# Patient Record
Sex: Female | Born: 1960 | Race: White | Hispanic: No | Marital: Single | Smoking: Never smoker
Health system: Southern US, Community
[De-identification: ages and names within clinical notes are randomized; demographics above are authoritative.]

## PROBLEM LIST (undated history)

## (undated) DIAGNOSIS — H409 Unspecified glaucoma: Secondary | ICD-10-CM

## (undated) DIAGNOSIS — G43909 Migraine, unspecified, not intractable, without status migrainosus: Secondary | ICD-10-CM

## (undated) DIAGNOSIS — R519 Headache, unspecified: Secondary | ICD-10-CM

## (undated) DIAGNOSIS — K635 Polyp of colon: Secondary | ICD-10-CM

## (undated) DIAGNOSIS — F32A Depression, unspecified: Secondary | ICD-10-CM

## (undated) DIAGNOSIS — E785 Hyperlipidemia, unspecified: Secondary | ICD-10-CM

## (undated) DIAGNOSIS — B019 Varicella without complication: Secondary | ICD-10-CM

## (undated) HISTORY — DX: Migraine, unspecified, not intractable, without status migrainosus: G43.909

## (undated) HISTORY — PX: HEMORRHOID SURGERY: SHX153

## (undated) HISTORY — DX: Unspecified glaucoma: H40.9

## (undated) HISTORY — DX: Varicella without complication: B01.9

## (undated) HISTORY — PX: ABDOMINAL HYSTERECTOMY: SHX81

## (undated) HISTORY — PX: TONSILLECTOMY: SUR1361

## (undated) HISTORY — PX: APPENDECTOMY: SHX54

## (undated) HISTORY — DX: Headache, unspecified: R51.9

## (undated) HISTORY — DX: Depression, unspecified: F32.A

## (undated) HISTORY — DX: Hyperlipidemia, unspecified: E78.5

## (undated) HISTORY — DX: Polyp of colon: K63.5

## (undated) HISTORY — PX: POLYPECTOMY: SHX149

---

## 2012-05-06 HISTORY — PX: INCONTINENCE SURGERY: SHX676

## 2015-12-05 HISTORY — PX: COLONOSCOPY: SHX174

## 2020-05-12 LAB — BASIC METABOLIC PANEL
BUN: 12 (ref 4–21)
CO2: 31 — AB (ref 13–22)
Chloride: 107 (ref 99–108)
Creatinine: 0.9 (ref 0.5–1.1)
Glucose: 98
Potassium: 4 mEq/L (ref 3.5–5.1)
Sodium: 142 (ref 137–147)

## 2020-05-12 LAB — CBC AND DIFFERENTIAL
HCT: 40 (ref 36–46)
Hemoglobin: 12.7 (ref 12.0–16.0)
Platelets: 231 10*3/uL (ref 150–400)
WBC: 6.1

## 2020-05-12 LAB — HEPATIC FUNCTION PANEL
ALT: 18 U/L (ref 7–35)
AST: 22 (ref 13–35)
Alkaline Phosphatase: 72 (ref 25–125)
Bilirubin, Total: 0.5

## 2020-05-12 LAB — LIPID PANEL
Cholesterol: 167 (ref 0–200)
HDL: 59 (ref 35–70)
LDL Cholesterol: 85
Triglycerides: 116 (ref 40–160)

## 2020-05-12 LAB — COMPREHENSIVE METABOLIC PANEL
Albumin: 4.3 (ref 3.5–5.0)
Calcium: 9 (ref 8.7–10.7)

## 2020-05-12 LAB — VITAMIN D 25 HYDROXY (VIT D DEFICIENCY, FRACTURES): Vit D, 25-Hydroxy: 44.8

## 2020-05-12 LAB — TSH: TSH: 1.75 (ref 0.41–5.90)

## 2021-09-25 ENCOUNTER — Encounter: Payer: Self-pay | Admitting: Family Medicine

## 2021-09-25 ENCOUNTER — Ambulatory Visit (INDEPENDENT_AMBULATORY_CARE_PROVIDER_SITE_OTHER): Payer: 59 | Admitting: Family Medicine

## 2021-09-25 VITALS — BP 128/80 | HR 93 | Temp 98.8°F | Resp 16 | Ht 67.0 in | Wt 170.2 lb

## 2021-09-25 DIAGNOSIS — E785 Hyperlipidemia, unspecified: Secondary | ICD-10-CM

## 2021-09-25 DIAGNOSIS — Z1239 Encounter for other screening for malignant neoplasm of breast: Secondary | ICD-10-CM

## 2021-09-25 DIAGNOSIS — D229 Melanocytic nevi, unspecified: Secondary | ICD-10-CM

## 2021-09-25 DIAGNOSIS — F3341 Major depressive disorder, recurrent, in partial remission: Secondary | ICD-10-CM

## 2021-09-25 DIAGNOSIS — F419 Anxiety disorder, unspecified: Secondary | ICD-10-CM

## 2021-09-25 DIAGNOSIS — Z1211 Encounter for screening for malignant neoplasm of colon: Secondary | ICD-10-CM

## 2021-09-25 NOTE — Patient Instructions (Addendum)
A few things to remember from today's visit:   Colon cancer screening - Plan: Ambulatory referral to Gastroenterology  Hyperlipidemia, unspecified hyperlipidemia type - Plan: Comprehensive metabolic panel, Lipid panel  Encounter for screening for malignant neoplasm of breast, unspecified screening modality - Plan: MM 3D SCREEN BREAST BILATERAL  Recurrent major depression in partial remission ([place])  Atypical nevus - Plan: Ambulatory referral to Dermatology  If you need refills please call your pharmacy. Do not use My Chart to request refills or for acute issues that need immediate attention.   Please be sure medication list is accurate. If a new problem present, please set up appointment sooner than planned today.  No changes today. We can try decreasing sertraline next visit.

## 2021-09-25 NOTE — Progress Notes (Addendum)
HPI: [name] is a 61 y.o. female, who is here today to establish care.  Former PCP: In [place]. Last preventive routine visit: 10/2020. She just moved to the area from [place] She moved to be closer to her daughter, she is driving her to work. Her daughter has hx of severe ADHD and does not drive.   Chronic medical problems: Depression,migraine headaches,HLD,glaucoma,and chronic back pain among some. Father colon cancer at 33. Last colonoscopy about 5 years ago. Due for mammogram. S/P hysterectomy, no hx of gyn malignancy.  Fall in 10/2020, left ankle fracture.  Concerns today:  Couple of moles removed by dermatologist. She is concerned about mole on back. Has had atypical moles removed. No hx of skin cancer.  Chronic lower back pain, 06/2018. Has received an epidural injection. Pain is not radiated. Negative for saddle anesthesia or bladder/bowel dysfunction. She was seeing spine specialist in [place].  Depression and anxiety: Depression dx'ed about 30 years ago. Anxiety since her early 20's. She has seen psychiatrist but for the past few years her pcp has been prescribing medication.Last visit with psychiatrist about 5 years ago. She is on Sertraline 100 mg 2 tabs daily and Wellbutrin XL 150 mg daily. Denies depressed mood.     09/25/2021    2:38 PM  Depression screen PHQ 2/9  Decreased Interest 0  Down, Depressed, Hopeless 0  PHQ - 2 Score 0   HLD on Simvastatin 40 mg daily. She follows low fat diet. She has tolerated medication well.  Review of Systems  Constitutional:  Negative for activity change, appetite change and fever.  HENT:  Negative for mouth sores, nosebleeds and sore throat.   Eyes:  Negative for redness and visual disturbance.  Respiratory:  Negative for cough, shortness of breath and wheezing.   Cardiovascular:  Negative for chest pain, palpitations and leg swelling.  Gastrointestinal:  Negative for abdominal pain, blood in stool, nausea and  vomiting.       Negative for changes in bowel habits.  Genitourinary:  Negative for decreased urine volume and hematuria.  Neurological:  Negative for syncope, facial asymmetry and weakness.  Rest see pertinent positives and negatives per HPI.  Current Outpatient Medications on File Prior to Visit  Medication Sig Dispense Refill   buPROPion (WELLBUTRIN XL) 150 MG 24 hr tablet Take 150 mg by mouth every morning.     latanoprost (XALATAN) 0.005 % ophthalmic solution Place 1 drop into both eyes every evening.     sertraline (ZOLOFT) 100 MG tablet Take 200 mg by mouth daily.     simvastatin (ZOCOR) 40 MG tablet Take 1 tablet by mouth daily.     No current facility-administered medications on file prior to visit.   Past Medical History:  Diagnosis Date   Chicken pox    Colon polyps    Depression    Frequent headaches    Glaucoma    Hyperlipidemia    Migraines    No Known Allergies  Family History  Problem Relation Age of Onset   Hyperlipidemia Mother    Hearing loss Mother    Hypertension Father    Early death Father    Diabetes Father    Cancer Father    Social History   Socioeconomic History   Marital status: Single    Spouse name: Not on file   Number of children: Not on file   Years of education: Not on file   Highest education level: Not on file  Occupational History  Not on file  Tobacco Use   Smoking status: Never    Passive exposure: Never   Smokeless tobacco: Never  Substance and Sexual Activity   Alcohol use: Not Currently   Drug use: Never   Sexual activity: Not Currently  Other Topics Concern   Not on file  Social History Narrative   Not on file   Social Determinants of Health   Financial Resource Strain: Not on file  Food Insecurity: Not on file  Transportation Needs: Not on file  Physical Activity: Not on file  Stress: Not on file  Social Connections: Not on file   Vitals:   09/25/21 1424  BP: 128/80  Pulse: 93  Resp: 16  Temp: 98.8  F (37.1 C)  SpO2: 97%   Body mass index is 26.66 kg/m.  Physical Exam Vitals and nursing note reviewed.  Constitutional:      General: She is not in acute distress.    Appearance: She is well-developed.  HENT:     Head: Normocephalic and atraumatic.     Mouth/Throat:     Mouth: Mucous membranes are moist.     Pharynx: Oropharynx is clear.  Eyes:     Conjunctiva/sclera: Conjunctivae normal.  Cardiovascular:     Rate and Rhythm: Normal rate and regular rhythm.     Pulses:          Dorsalis pedis pulses are 2+ on the right side and 2+ on the left side.     Heart sounds: No murmur heard. Pulmonary:     Effort: Pulmonary effort is normal. No respiratory distress.     Breath sounds: Normal breath sounds.  Abdominal:     Palpations: Abdomen is soft. There is no hepatomegaly or mass.     Tenderness: There is no abdominal tenderness.  Lymphadenopathy:     Cervical: No cervical adenopathy.  Skin:    General: Skin is warm.     Findings: No erythema or rash.     Comments: No suspicious lesions. Hyperpigmented raised lesions scattered on body:AK  Neurological:     General: No focal deficit present.     Mental Status: She is alert and oriented to person, place, and time.     Cranial Nerves: No cranial nerve deficit.     Gait: Gait normal.  Psychiatric:     Comments: Well groomed, good eye contact.   ASSESSMENT AND PLAN:  Desiree Doyle was seen today for establish care.  Diagnoses and all orders for this visit: Orders Placed This Encounter  Procedures   MM 3D SCREEN BREAST BILATERAL   Comprehensive metabolic panel   Lipid panel   Ambulatory referral to Gastroenterology   Ambulatory referral to Dermatology   Lab Results  Component Value Date   CHOL 190 09/25/2021   HDL 69.90 09/25/2021   LDLCALC 91 09/25/2021   TRIG 145.0 09/25/2021   CHOLHDL 3 09/25/2021   Lab Results  Component Value Date   CREATININE 0.94 09/25/2021   BUN 9 09/25/2021   NA 140 09/25/2021   K 4.3  09/25/2021   CL 103 09/25/2021   CO2 27 09/25/2021   Lab Results  Component Value Date   ALT 17 09/25/2021   AST 23 09/25/2021   ALKPHOS 65 09/25/2021   BILITOT 0.6 09/25/2021   Hyperlipidemia, unspecified hyperlipidemia type Continue Simvastatin 40 mg daily and low fat diet. Further recommendations according to lipid panel results.  Encounter for screening for malignant neoplasm of breast, unspecified screening modality -  MM 3D SCREEN BREAST BILATERAL; Future  Recurrent major depression in partial remission ([place]) For now continue Wellbutrin XL 150 mg daily and Sertraline 200 mg daily. In a few months we can consider starting weaning off medications as tolerated.  Atypical nevus Hx of atypical melanocytic moles. Derma referral placed.  Anxiety disorder, unspecified type Well controlled. Continue Sertraline 200 mg daily, will consider decreasing dose next visit.  Colon cancer screening -     Ambulatory referral to Gastroenterology  Return in about 6 months (around 03/28/2022).  Desiree Doyle G. Martinique, MD  [facility]. [place] office.

## 2021-09-26 LAB — COMPREHENSIVE METABOLIC PANEL
ALT: 17 U/L (ref 0–35)
AST: 23 U/L (ref 0–37)
Albumin: 5.1 g/dL (ref 3.5–5.2)
Alkaline Phosphatase: 65 U/L (ref 39–117)
BUN: 9 mg/dL (ref 6–23)
CO2: 27 mEq/L (ref 19–32)
Calcium: 10.1 mg/dL (ref 8.4–10.5)
Chloride: 103 mEq/L (ref 96–112)
Creatinine, Ser: 0.94 mg/dL (ref 0.40–1.20)
GFR: 65.42 mL/min (ref >60.00)
Glucose, Bld: 91 mg/dL (ref 70–99)
Potassium: 4.3 mEq/L (ref 3.5–5.1)
Sodium: 140 mEq/L (ref 135–145)
Total Bilirubin: 0.6 mg/dL (ref 0.2–1.2)
Total Protein: 7.7 g/dL (ref 6.0–8.3)

## 2021-09-26 LAB — LIPID PANEL
Cholesterol: 190 mg/dL (ref 0–200)
HDL: 69.9 mg/dL (ref >39.00)
LDL Cholesterol: 91 mg/dL (ref 0–99)
NonHDL: 119.87
Total CHOL/HDL Ratio: 3
Triglycerides: 145 mg/dL (ref 0.0–149.0)
VLDL: 29 mg/dL (ref 0.0–40.0)

## 2021-10-04 ENCOUNTER — Ambulatory Visit
Admission: RE | Admit: 2021-10-04 | Discharge: 2021-10-04 | Disposition: A | Discharge status: Home / Self Care | Payer: 59 | Source: Ambulatory Visit | Attending: Family Medicine | Admitting: Family Medicine

## 2021-10-04 DIAGNOSIS — Z1239 Encounter for other screening for malignant neoplasm of breast: Secondary | ICD-10-CM

## 2021-10-12 ENCOUNTER — Encounter: Payer: Self-pay | Admitting: Family Medicine

## 2021-11-13 ENCOUNTER — Telehealth: Payer: Self-pay | Admitting: Gastroenterology

## 2021-11-13 NOTE — Telephone Encounter (Signed)
Good Afternoon Dr. Ardis Hughs,  D.O.D for 09/25/21 PM    We have received a referral to have a colonoscopy and we have also received patients last colonoscopy records. Patient had her last colonoscopy in [place] in 2017. I will be sending patients records for you to review, will you please review and advise on scheduling?  Thank you.

## 2021-11-14 ENCOUNTER — Encounter: Payer: Self-pay | Admitting: Gastroenterology

## 2021-11-14 NOTE — Telephone Encounter (Signed)
Patient was scheduled for PV on 8/25 at 9:00 and colon on 9/15 at 2:00.

## 2021-12-24 ENCOUNTER — Telehealth: Payer: Self-pay | Admitting: Gastroenterology

## 2021-12-24 NOTE — Telephone Encounter (Signed)
This patient returned your call.  She said she was out of town when you called her initially.  Thank you.

## 2021-12-24 NOTE — Telephone Encounter (Signed)
Spoke with pt.  Rescheduled with Dr. Havery Moros for the exact date and time she was previously scheduled

## 2021-12-28 ENCOUNTER — Ambulatory Visit (AMBULATORY_SURGERY_CENTER): Payer: 59 | Admitting: *Deleted

## 2021-12-28 VITALS — Ht 67.0 in | Wt 169.0 lb

## 2021-12-28 DIAGNOSIS — Z86010 Personal history of colonic polyps: Secondary | ICD-10-CM

## 2021-12-28 DIAGNOSIS — Z800 Family history of malignant neoplasm of digestive organs: Secondary | ICD-10-CM

## 2021-12-28 MED ORDER — NA SULFATE-K SULFATE-MG SULF 17.5-3.13-1.6 GM/177ML PO SOLN: 1.0000 | ORAL | 0 refills | Status: DC

## 2021-12-28 MED ORDER — ONDANSETRON HCL 4 MG PO TABS: 4.0000 mg | ORAL_TABLET | ORAL | 0 refills | Status: DC

## 2021-12-28 NOTE — Progress Notes (Signed)
Patient is here in-person for PV. Patient denies any allergies to eggs or soy. Patient denies any problems with anesthesia/sedation. Patient is not on any oxygen at home. Patient is not taking any diet/weight loss medications or blood thinners. Went over procedure prep instructions with the patient. Patient is aware of our care-partner policy. Patient notified to use Good-Rx for prescription.    

## 2022-01-03 ENCOUNTER — Encounter: Payer: Self-pay | Admitting: Gastroenterology

## 2022-01-05 ENCOUNTER — Other Ambulatory Visit: Payer: Self-pay | Admitting: Family Medicine

## 2022-01-05 DIAGNOSIS — F3341 Major depressive disorder, recurrent, in partial remission: Secondary | ICD-10-CM

## 2022-01-05 DIAGNOSIS — F419 Anxiety disorder, unspecified: Secondary | ICD-10-CM

## 2022-01-05 DIAGNOSIS — E785 Hyperlipidemia, unspecified: Secondary | ICD-10-CM

## 2022-01-08 MED ORDER — BUPROPION HCL ER (XL) 150 MG PO TB24: 150.0000 mg | ORAL_TABLET | Freq: Every morning | ORAL | 2 refills | Status: DC

## 2022-01-08 MED ORDER — SIMVASTATIN 40 MG PO TABS: 40.0000 mg | ORAL_TABLET | Freq: Every day | ORAL | 2 refills | Status: DC

## 2022-01-08 MED ORDER — SERTRALINE HCL 100 MG PO TABS: 150.0000 mg | ORAL_TABLET | Freq: Every day | ORAL | 2 refills | Status: DC

## 2022-01-18 ENCOUNTER — Encounter: Payer: Self-pay | Admitting: Gastroenterology

## 2022-01-18 ENCOUNTER — Ambulatory Visit (AMBULATORY_SURGERY_CENTER): Payer: 59 | Admitting: Gastroenterology

## 2022-01-18 VITALS — BP 129/73 | HR 63 | Temp 97.5°F | Resp 13 | Ht 67.0 in | Wt 169.0 lb

## 2022-01-18 DIAGNOSIS — D123 Benign neoplasm of transverse colon: Secondary | ICD-10-CM

## 2022-01-18 DIAGNOSIS — D124 Benign neoplasm of descending colon: Secondary | ICD-10-CM

## 2022-01-18 DIAGNOSIS — Z09 Encounter for follow-up examination after completed treatment for conditions other than malignant neoplasm: Secondary | ICD-10-CM

## 2022-01-18 DIAGNOSIS — Z800 Family history of malignant neoplasm of digestive organs: Secondary | ICD-10-CM | POA: Diagnosis not present

## 2022-01-18 DIAGNOSIS — Z86010 Personal history of colonic polyps: Secondary | ICD-10-CM | POA: Diagnosis not present

## 2022-01-18 MED ORDER — SODIUM CHLORIDE 0.9 % IV SOLN: 500.0000 mL | Freq: Once | INTRAVENOUS | Status: DC

## 2022-01-18 NOTE — Progress Notes (Signed)
Called to room to assist during endoscopic procedure.  Patient ID and intended procedure confirmed with present staff. Received instructions for my participation in the procedure from the performing physician.  

## 2022-01-18 NOTE — Progress Notes (Signed)
[facility] History and Physical   Primary Care Physician:  Martinique, Betty G, MD   Reason for Procedure:   History of colon polyps  Plan:    colonoscopy     HPI: Desiree Doyle is a 61 y.o. female  here for colonoscopy surveillance - SSPs removed in > 5 yrs ago at another center ([place]). Patient denies any bowel symptoms at this time. Father had colon cancer dx age 102. Otherwise feels well without any cardiopulmonary symptoms.   I have discussed risks / benefits of anesthesia and endoscopic procedure with Maximino Greenland and they wish to proceed with the exams as outlined today.    Past Medical History:  Diagnosis Date   Chicken pox    Colon polyps    Depression    Frequent headaches    Glaucoma    Hyperlipidemia    Migraines     Past Surgical History:  Procedure Laterality Date   ABDOMINAL HYSTERECTOMY     APPENDECTOMY     COLONOSCOPY  12/2015   [facility]     INCONTINENCE SURGERY  2014   POLYPECTOMY     TONSILLECTOMY      Prior to Admission medications   Medication Sig Start Date End Date Taking? Authorizing Provider  buPROPion (WELLBUTRIN XL) 150 MG 24 hr tablet Take 1 tablet (150 mg total) by mouth every morning. 01/08/22  Yes Martinique, Betty G, MD  CALCIUM PO Take by mouth.   Yes [provider]  Cholecalciferol (VITAMIN D3 PO) Take by mouth.   Yes [provider]  latanoprost (XALATAN) 0.005 % ophthalmic solution Place 1 drop into both eyes every evening. 08/10/21  Yes [provider]  ondansetron (ZOFRAN) 4 MG tablet Take 1 tablet (4 mg total) by mouth as directed. Take one Zofran pill 30-60 minutes before each colonoscopy prep dose 12/28/21  Yes Witt Plitt, Carlota Raspberry, MD  sertraline (ZOLOFT) 100 MG tablet Take 1.5 tablets (150 mg total) by mouth daily. 01/08/22  Yes Martinique, Betty G, MD  simvastatin (ZOCOR) 40 MG tablet Take 1 tablet (40 mg total) by mouth daily. 01/08/22  Yes Martinique, Betty G, MD  Multiple Vitamins-Minerals  (PRESERVISION AREDS PO) Take by mouth.    [provider]    Current Outpatient Medications  Medication Sig Dispense Refill   buPROPion (WELLBUTRIN XL) 150 MG 24 hr tablet Take 1 tablet (150 mg total) by mouth every morning. 30 tablet 2   CALCIUM PO Take by mouth.     Cholecalciferol (VITAMIN D3 PO) Take by mouth.     latanoprost (XALATAN) 0.005 % ophthalmic solution Place 1 drop into both eyes every evening.     ondansetron (ZOFRAN) 4 MG tablet Take 1 tablet (4 mg total) by mouth as directed. Take one Zofran pill 30-60 minutes before each colonoscopy prep dose 2 tablet 0   sertraline (ZOLOFT) 100 MG tablet Take 1.5 tablets (150 mg total) by mouth daily. 45 tablet 2   simvastatin (ZOCOR) 40 MG tablet Take 1 tablet (40 mg total) by mouth daily. 30 tablet 2   Multiple Vitamins-Minerals (PRESERVISION AREDS PO) Take by mouth.     Current Facility-Administered Medications  Medication Dose Route Frequency Provider Last Rate Last Admin   0.9 %  sodium chloride infusion  500 mL Intravenous Once Claribel Sachs, Carlota Raspberry, MD        Allergies as of 01/18/2022   (No Known Allergies)    Family History  Problem Relation Age of Onset  Hyperlipidemia Mother    Hearing loss Mother    Hypertension Father    Early death Father    Diabetes Father    Colon cancer Father 49   Breast cancer Maternal Aunt        x2 22s or 22s   Esophageal cancer Neg Hx    Stomach cancer Neg Hx    Rectal cancer Neg Hx     Social History   Socioeconomic History   Marital status: Single    Spouse name: Not on file   Number of children: Not on file   Years of education: Not on file   Highest education level: Not on file  Occupational History   Not on file  Tobacco Use   Smoking status: Never    Passive exposure: Never   Smokeless tobacco: Never  Vaping Use   Vaping Use: Never used  Substance and Sexual Activity   Alcohol use: Yes    Comment: once a month   Drug use: Never   Sexual activity: Not  Currently  Other Topics Concern   Not on file  Social History Narrative   Not on file   Social Determinants of Health   Financial Resource Strain: Not on file  Food Insecurity: Not on file  Transportation Needs: Not on file  Physical Activity: Not on file  Stress: Not on file  Social Connections: Not on file  Intimate Partner Violence: Not on file    Review of Systems: All other review of systems negative except as mentioned in the HPI.  Physical Exam: Vital signs BP (!) 153/95   Pulse 88   Temp (!) 97.5 F (36.4 C)   Ht '5\' 7"'$  (1.702 m)   Wt 169 lb (76.7 kg)   SpO2 95%   BMI 26.47 kg/m   General:   Alert,  Well-developed, pleasant and cooperative in NAD Lungs:  Clear throughout to auscultation.   Heart:  Regular rate and rhythm Abdomen:  Soft, nontender and nondistended.   Neuro/Psych:  Alert and cooperative. Normal mood and affect. A and O x 3  Jolly Mango, MD [facility]

## 2022-01-18 NOTE — Op Note (Signed)
[facility] Patient Name: Desiree Doyle Procedure Date: 01/18/2022 2:15 PM MRN: [id number] Endoscopist: Remo Lipps P. Havery Moros , MD Age: 61 Referring MD:  Date of Birth: [birth year] Gender: Female Account #: [id number] Procedure:                Colonoscopy Indications:              High risk colon cancer surveillance: Personal                            history of colonic polyps - sessile serrated polyps                            removed > 5 years ago, father had colon cancer dx                            age 78 Medicines:                Monitored Anesthesia Care Procedure:                Pre-Anesthesia Assessment:                           - Prior to the procedure, a History and Physical                            was performed, and patient medications and                            allergies were reviewed. The patient's tolerance of                            previous anesthesia was also reviewed. The risks                            and benefits of the procedure and the sedation                            options and risks were discussed with the patient.                            All questions were answered, and informed consent                            was obtained. Prior Anticoagulants: The patient has                            taken no previous anticoagulant or antiplatelet                            agents. ASA Grade Assessment: II - A patient with                            mild systemic disease. After reviewing the risks  and benefits, the patient was deemed in                            satisfactory condition to undergo the procedure.                           After obtaining informed consent, the colonoscope                            was passed under direct vision. Throughout the                            procedure, the patient's blood pressure, pulse, and                            oxygen saturations were monitored continuously. The                             Olympus PCF-H190DL (#[id number]) Colonoscope was                            introduced through the anus and advanced to the the                            cecum, identified by appendiceal orifice and                            ileocecal valve. The colonoscopy was technically                            difficult and complex due to inadequate bowel prep                            and a tortuous colon. The patient tolerated the                            procedure well. The quality of the bowel                            preparation was fair. The ileocecal valve,                            appendiceal orifice, and rectum were photographed. Scope In: 2:19:29 PM Scope Out: 2:45:38 PM Scope Withdrawal Time: 0 hours 16 minutes 57 seconds  Total Procedure Duration: 0 hours 26 minutes 9 seconds  Findings:                 The perianal and digital rectal examinations were                            normal.                           A large amount of liquid stool was found in the  entire colon, making visualization difficult.                            Lavage of the colon was performed using copious                            amounts of sterile water, resulting in clearance                            with fair visualization. Most of the colon was well                            visualized. Cecum and parts of the sigmoid were                            hard to clear due to residual stool. No large                            polyps noted but flat lesions or smaller polyps may                            not have been seen in certain areas.                           Two sessile polyps were found in the transverse                            colon. The polyps were 3 to 5 mm in size. These                            polyps were removed with a cold snare. Resection                            and retrieval were complete.                           A 3 mm polyp  was found in the descending colon. The                            polyp was sessile. The polyp was removed with a                            cold snare. Resection and retrieval were complete.                           A few small-mouthed diverticula were found in the                            sigmoid colon.                           The colon was extremely tortuous. This, in  conjunction with prep, made for difficult cecal                            intubation.                           Anal papilla(e) were hypertrophied.                           Internal hemorrhoids were found during                            retroflexion. The hemorrhoids were small.                           The exam was otherwise without abnormality. Complications:            No immediate complications. Estimated blood loss:                            Minimal. Estimated Blood Loss:     Estimated blood loss was minimal. Impression:               - Preparation of the colon was fair. Significant                            time spent lavaging the colon.                           - Two 3 to 5 mm polyps in the transverse colon,                            removed with a cold snare. Resected and retrieved.                           - One 3 mm polyp in the descending colon, removed                            with a cold snare. Resected and retrieved.                           - Diverticulosis in the sigmoid colon.                           - Tortuous colon.                           - Anal papilla(e) were hypertrophied.                           - Internal hemorrhoids.                           - The examination was otherwise normal. Recommendation:           - Patient has a contact number available for  emergencies. The signs and symptoms of potential                            delayed complications were discussed with the                            patient. Return to  normal activities tomorrow.                            Written discharge instructions were provided to the                            patient.                           - Resume previous diet.                           - Continue present medications.                           - Await pathology results.                           - Repeat colonoscopy within 1 year because the                            bowel preparation was suboptimal. Remo Lipps P. Terrilynn Postell, MD 01/18/2022 2:51:42 PM This report has been signed electronically.

## 2022-01-18 NOTE — Progress Notes (Signed)
PT taken to PACU. Monitors in place. VSS. Report given to RN. 

## 2022-01-18 NOTE — Progress Notes (Signed)
Pt's states no medical or surgical changes since previsit or office visit. 

## 2022-01-18 NOTE — Patient Instructions (Signed)
Handouts on diverticulosis, polyps, and hemorrhoids given to patient.  Await pathology results. Repeat colonoscopy for surveillance within 1 year because the bowel preparation was sub-optimal. Resume previous diet and continue present medications    YOU HAD AN ENDOSCOPIC PROCEDURE TODAY AT [facility]:   Refer to the procedure report that was given to you for any specific questions about what was found during the examination.  If the procedure report does not answer your questions, please call your gastroenterologist to clarify.  If you requested that your care partner not be given the details of your procedure findings, then the procedure report has been included in a sealed envelope for you to review at your convenience later.  YOU SHOULD EXPECT: Some feelings of bloating in the abdomen. Passage of more gas than usual.  Walking can help get rid of the air that was put into your GI tract during the procedure and reduce the bloating. If you had a lower endoscopy (such as a colonoscopy or flexible sigmoidoscopy) you may notice spotting of blood in your stool or on the toilet paper. If you underwent a bowel prep for your procedure, you may not have a normal bowel movement for a few days.  Please Note:  You might notice some irritation and congestion in your nose or some drainage.  This is from the oxygen used during your procedure.  There is no need for concern and it should clear up in a day or so.  SYMPTOMS TO REPORT IMMEDIATELY:  Following lower endoscopy (colonoscopy or flexible sigmoidoscopy):  Excessive amounts of blood in the stool  Significant tenderness or worsening of abdominal pains  Swelling of the abdomen that is new, acute  Fever of 100F or higher  For urgent or emergent issues, a gastroenterologist can be reached at any hour by calling [phone]. Do not use MyChart messaging for urgent concerns.    DIET:  We do recommend a small meal at first, but then you  may proceed to your regular diet.  Drink plenty of fluids but you should avoid alcoholic beverages for 24 hours.  ACTIVITY:  You should plan to take it easy for the rest of today and you should NOT DRIVE or use heavy machinery until tomorrow (because of the sedation medicines used during the test).    FOLLOW UP: Our staff will call the number listed on your records the next business day following your procedure.  We will call around 7:15- 8:00 am to check on you and address any questions or concerns that you may have regarding the information given to you following your procedure. If we do not reach you, we will leave a message.     If any biopsies were taken you will be contacted by phone or by letter within the next 1-3 weeks.  Please call us at [phone] if you have not heard about the biopsies in 3 weeks.    SIGNATURES/CONFIDENTIALITY: You and/or your care partner have signed paperwork which will be entered into your electronic medical record.  These signatures attest to the fact that that the information above on your After Visit Summary has been reviewed and is understood.  Full responsibility of the confidentiality of this discharge information lies with you and/or your care-partner.

## 2022-01-21 ENCOUNTER — Telehealth: Payer: Self-pay | Admitting: *Deleted

## 2022-01-21 NOTE — Telephone Encounter (Signed)
Attempted to call patient for their post-procedure follow-up call. No answer. Left voicemail.   

## 2022-02-01 ENCOUNTER — Other Ambulatory Visit: Payer: Self-pay

## 2022-02-01 DIAGNOSIS — F3341 Major depressive disorder, recurrent, in partial remission: Secondary | ICD-10-CM

## 2022-02-01 DIAGNOSIS — F419 Anxiety disorder, unspecified: Secondary | ICD-10-CM

## 2022-02-01 MED ORDER — BUPROPION HCL ER (XL) 150 MG PO TB24: 150.0000 mg | ORAL_TABLET | Freq: Every morning | ORAL | 1 refills | Status: DC

## 2022-02-01 MED ORDER — SERTRALINE HCL 100 MG PO TABS: 150.0000 mg | ORAL_TABLET | Freq: Every day | ORAL | 1 refills | Status: DC

## 2022-04-01 ENCOUNTER — Ambulatory Visit: Payer: 59 | Admitting: Family Medicine

## 2022-04-01 ENCOUNTER — Encounter: Payer: Self-pay | Admitting: Family Medicine

## 2022-04-01 VITALS — BP 120/70 | HR 96 | Temp 98.4°F | Resp 16 | Ht 67.0 in | Wt 168.4 lb

## 2022-04-01 DIAGNOSIS — F3341 Major depressive disorder, recurrent, in partial remission: Secondary | ICD-10-CM | POA: Diagnosis not present

## 2022-04-01 DIAGNOSIS — F419 Anxiety disorder, unspecified: Secondary | ICD-10-CM | POA: Diagnosis not present

## 2022-04-01 DIAGNOSIS — Z23 Encounter for immunization: Secondary | ICD-10-CM

## 2022-04-01 MED ORDER — SERTRALINE HCL 50 MG PO TABS: 125.0000 mg | ORAL_TABLET | Freq: Every day | ORAL | 2 refills | Status: DC

## 2022-04-01 NOTE — Patient Instructions (Addendum)
A few things to remember from today's visit:  Anxiety disorder, unspecified type  Recurrent major depression in partial remission ([place])  Need for influenza vaccination - Plan: Flu Vaccine QUAD 51moIM (Fluarix, Fluzone & Alfiuria Quad PF)  Decrease dose of Sertraline to continue alternating between 150 and 125 mg for 2 weeks then 125 mg daily. No changes in Wellbutrin.  If you need refills for medications you take chronically, please call your pharmacy. Do not use My Chart to request refills or for acute issues that need immediate attention. If you send a my chart message, it may take a few days to be addressed, specially if I am not in the office.  Please be sure medication list is accurate. If a new problem present, please set up appointment sooner than planned today.

## 2022-04-01 NOTE — Assessment & Plan Note (Signed)
Problem is stable. She expresses a willingness to continue decreasing the Sertraline dosage as tolerated. She will decrease dose of sertraline, alternate between 150 mg and 125 mg every other day for 2 weeks then continue sertraline 125 mg daily until her next visit.

## 2022-04-01 NOTE — Assessment & Plan Note (Addendum)
Problem is well controlled. Continue Wellbutrin XL 150 mg daily. Sertraline dose decreased, she will alternate between 150 and 125 mg every other day for 2 weeks and then continue 125 mg daily. Follow-up in 09/2022, before if needed. Instructed about warning signs.

## 2022-04-01 NOTE — Progress Notes (Signed)
HPI: [name] is a 61 y.o. female with medical history significant for anxiety, depression, hyperlipidemia here today for chronic disease management.  Last seen on 09/25/2021. She has been on Wellbutrin XL 150 mg and sertraline for many years, since last visit she has reduced her Sertraline dosage from 200 mg to 150 mg daily. She mentions attempting to decrease the dosage to 100 mg but experienced feeling off, so she increased it back to 150 mg.    She is currently retired and reports that life has become less stressful since retirement. She is not yet ready to discontinue Sertraline completely due to a history of severe anxiety and depression.Marland Kitchen    04/01/2022    9:00 AM 09/25/2021    2:38 PM  Depression screen PHQ 2/9  Decreased Interest 1 0  Down, Depressed, Hopeless 0 0  PHQ - 2 Score 1 0  Altered sleeping 2   Tired, decreased energy 1   Change in appetite 0   Feeling bad or failure about yourself  0   Trouble concentrating 0   Moving slowly or fidgety/restless 0   Suicidal thoughts 0   PHQ-9 Score 4   Difficult doing work/chores Not difficult at all    States that had her colonoscopy in September, but the bowel prep did not work as expected. She has a history of polyps and was advised to have another colonoscopy in a year. She also had a mammogram in June/2023.  Review of Systems  Constitutional:  Negative for chills and fever.  Respiratory:  Negative for chest tightness and shortness of breath.   Cardiovascular:  Negative for chest pain and leg swelling.  Gastrointestinal:  Negative for abdominal pain, nausea and vomiting.  Neurological:  Negative for syncope, weakness and headaches.  Psychiatric/Behavioral:  Negative for confusion and hallucinations. The patient is nervous/anxious.   See other pertinent positives and negatives in HPI.  Current Outpatient Medications on File Prior to Visit  Medication Sig Dispense Refill   buPROPion (WELLBUTRIN XL) 150 MG 24 hr  tablet Take 1 tablet (150 mg total) by mouth every morning. 90 tablet 1   CALCIUM PO Take by mouth.     Cholecalciferol (VITAMIN D3 PO) Take by mouth.     lansoprazole (PREVACID) 15 MG capsule Take 15 mg by mouth daily at 12 noon.     latanoprost (XALATAN) 0.005 % ophthalmic solution Place 1 drop into both eyes every evening.     Multiple Vitamins-Minerals (PRESERVISION AREDS PO) Take by mouth.     simvastatin (ZOCOR) 40 MG tablet Take 1 tablet (40 mg total) by mouth daily. 30 tablet 2   No current facility-administered medications on file prior to visit.   Past Medical History:  Diagnosis Date   Chicken pox    Colon polyps    Depression    Frequent headaches    Glaucoma    Hyperlipidemia    Migraines    No Known Allergies  Social History   Socioeconomic History   Marital status: Single    Spouse name: Not on file   Number of children: Not on file   Years of education: Not on file   Highest education level: Not on file  Occupational History   Not on file  Tobacco Use   Smoking status: Never    Passive exposure: Never   Smokeless tobacco: Never  Vaping Use   Vaping Use: Never used  Substance and Sexual Activity   Alcohol use: Yes    Comment:  once a month   Drug use: Never   Sexual activity: Not Currently  Other Topics Concern   Not on file  Social History Narrative   Not on file   Social Determinants of Health   Financial Resource Strain: Not on file  Food Insecurity: Not on file  Transportation Needs: Not on file  Physical Activity: Not on file  Stress: Not on file  Social Connections: Not on file    Vitals:   04/01/22 0847  BP: 120/70  Pulse: 96  Resp: 16  Temp: 98.4 F (36.9 C)  SpO2: 97%   Body mass index is 26.37 kg/m.  Physical Exam Vitals and nursing note reviewed.  Constitutional:      General: She is not in acute distress.    Appearance: She is well-developed and well-groomed.  HENT:     Head: Normocephalic and atraumatic.  Eyes:      Conjunctiva/sclera: Conjunctivae normal.  Cardiovascular:     Rate and Rhythm: Normal rate and regular rhythm.     Heart sounds: No murmur heard. Pulmonary:     Effort: Pulmonary effort is normal. No respiratory distress.     Breath sounds: Normal breath sounds.  Abdominal:     Palpations: Abdomen is soft. There is no mass.     Tenderness: There is no abdominal tenderness.  Musculoskeletal:     Right lower leg: No edema.     Left lower leg: No edema.  Skin:    General: Skin is warm.     Findings: No erythema or rash.  Neurological:     General: No focal deficit present.     Mental Status: She is alert and oriented to person, place, and time.     Gait: Gait normal.  Psychiatric:        Mood and Affect: Mood and affect normal.        Thought Content: Thought content does not include suicidal ideation. Thought content does not include suicidal plan.   ASSESSMENT AND PLAN:  [name] was seen today for follow-up.  Diagnoses and all orders for this visit: Orders Placed This Encounter  Procedures   Flu Vaccine QUAD 48moIM (Fluarix, Fluzone & Alfiuria Quad PF)   Recurrent major depression in partial remission [place] Problem is well controlled. Continue Wellbutrin XL 150 mg daily. Sertraline dose decreased, she will alternate between 150 and 125 mg every other day for 2 weeks and then continue 125 mg daily. Follow-up in 09/2022, before if needed. Instructed about warning signs.  Anxiety disorder Problem is stable. She expresses a willingness to continue decreasing the Sertraline dosage as tolerated. She will decrease dose of sertraline, alternate between 150 mg and 125 mg every other day for 2 weeks then continue sertraline 125 mg daily until her next visit.  Need for influenza vaccination -     Flu Vaccine QUAD 626moM (Fluarix, Fluzone & Alfiuria Quad PF)  Return in about 6 months (around 09/30/2022) for CPE.  Desiree Doyle G. JoMartiniqueMD  [facility].

## 2022-04-07 ENCOUNTER — Other Ambulatory Visit: Payer: Self-pay | Admitting: Family Medicine

## 2022-04-07 DIAGNOSIS — E785 Hyperlipidemia, unspecified: Secondary | ICD-10-CM

## 2022-04-23 ENCOUNTER — Ambulatory Visit
Admission: RE | Admit: 2022-04-23 | Discharge: 2022-04-23 | Disposition: A | Discharge status: Home / Self Care | Payer: 59 | Source: Ambulatory Visit | Attending: Urgent Care | Admitting: Urgent Care

## 2022-04-23 VITALS — BP 148/80 | HR 95 | Temp 98.4°F | Resp 16

## 2022-04-23 DIAGNOSIS — H6992 Unspecified Eustachian tube disorder, left ear: Secondary | ICD-10-CM

## 2022-04-23 DIAGNOSIS — K529 Noninfective gastroenteritis and colitis, unspecified: Secondary | ICD-10-CM

## 2022-04-23 MED ORDER — PSEUDOEPHEDRINE HCL 30 MG PO TABS: 30.0000 mg | ORAL_TABLET | Freq: Three times a day (TID) | ORAL | 0 refills | Status: DC | PRN

## 2022-04-23 MED ORDER — ONDANSETRON 8 MG PO TBDP: 8.0000 mg | ORAL_TABLET | Freq: Three times a day (TID) | ORAL | 0 refills | Status: DC | PRN

## 2022-04-23 MED ORDER — CETIRIZINE HCL 10 MG PO TABS: 10.0000 mg | ORAL_TABLET | Freq: Every day | ORAL | 0 refills | Status: DC

## 2022-04-23 MED ORDER — FLUTICASONE PROPIONATE 50 MCG/ACT NA SUSP: 2.0000 | Freq: Every day | NASAL | 12 refills | Status: AC

## 2022-04-23 MED ORDER — LOPERAMIDE HCL 2 MG PO CAPS: 2.0000 mg | ORAL_CAPSULE | Freq: Two times a day (BID) | ORAL | 0 refills | Status: DC | PRN

## 2022-04-23 MED ORDER — CEFDINIR 300 MG PO CAPS: 300.0000 mg | ORAL_CAPSULE | Freq: Two times a day (BID) | ORAL | 0 refills | Status: DC

## 2022-04-23 NOTE — ED Provider Notes (Signed)
[facility]  Note:  This document was prepared using Systems analyst and may include unintentional dictation errors.  MRN: [id number] DOB: [birth year]  Subjective:   Desiree Doyle is a 61 y.o. female presenting for 13-monthhistory of persistent bilateral ear irritation mostly on the left.  Has not had more persistent left ear fullness and itching, clicking and popping noises.  Occasionally has some dizziness associated with it.  No ear drainage, ear redness, swelling, cough, chest pain, throat pain, sinus drainage.  She also had some nausea and vomiting, diarrhea after she ate a [employer] yesterday.  Today reports that it is significantly better.  No fever, bloody stools, recent antibiotic use, hospitalizations or long distance travel.  Has not eaten raw foods, drank unfiltered water.  No history of GI disorders including Crohn's, IBS, ulcerative colitis.  Patient has regular colonoscopies given family history of colon cancer.  No current facility-administered medications for this encounter.  Current Outpatient Medications:    buPROPion (WELLBUTRIN XL) 150 MG 24 hr tablet, Take 1 tablet (150 mg total) by mouth every morning., Disp: 90 tablet, Rfl: 1   CALCIUM PO, Take by mouth., Disp: , Rfl:    Cholecalciferol (VITAMIN D3 PO), Take by mouth., Disp: , Rfl:    lansoprazole (PREVACID) 15 MG capsule, Take 15 mg by mouth daily at 12 noon., Disp: , Rfl:    latanoprost (XALATAN) 0.005 % ophthalmic solution, Place 1 drop into both eyes every evening., Disp: , Rfl:    Multiple Vitamins-Minerals (PRESERVISION AREDS PO), Take by mouth., Disp: , Rfl:    sertraline (ZOLOFT) 50 MG tablet, Take 2.5 tablets (125 mg total) by mouth daily., Disp: 180 tablet, Rfl: 2   simvastatin (ZOCOR) 40 MG tablet, TAKE 1 TABLET BY MOUTH EVERY DAY, Disp: 90 tablet, Rfl: 2   No Known Allergies  Past Medical History:  Diagnosis Date   Chicken pox    Colon polyps    Depression     Frequent headaches    Glaucoma    Hyperlipidemia    Migraines      Past Surgical History:  Procedure Laterality Date   ABDOMINAL HYSTERECTOMY     APPENDECTOMY     [place]    INCONTINENCE SURGERY  2014   POLYPECTOMY     TONSILLECTOMY      Family History  Problem Relation Age of Onset   Hyperlipidemia Mother    Hearing loss Mother    Hypertension Father    Early death Father    Diabetes Father    Colon cancer Father 65  Breast cancer Maternal Aunt        x2 [id number]  Esophageal cancer Neg Hx    Stomach cancer Neg Hx    Rectal cancer Neg Hx     Social History   Tobacco Use   Smoking status: Never    Passive exposure: Never   Smokeless tobacco: Never  Vaping Use   Vaping Use: Never used  Substance Use Topics   Alcohol use: Yes    Comment: once a month   Drug use: Never    ROS   Objective:   Vitals: BP (!) 148/80 (BP Location: Left Arm)   Pulse 95   Temp 98.4 F (36.9 C) (Oral)   Resp 16   SpO2 96%   Physical Exam Constitutional:      General: She is not in acute distress.  Appearance: Normal appearance. She is well-developed. She is not ill-appearing, toxic-appearing or diaphoretic.  HENT:     Head: Normocephalic and atraumatic.     Right Ear: Tympanic membrane, ear canal and external ear normal. No tenderness. There is no impacted cerumen. Tympanic membrane is not injected, perforated, erythematous or bulging.     Left Ear: Tympanic membrane, ear canal and external ear normal. No tenderness. There is no impacted cerumen. Tympanic membrane is not injected, perforated, erythematous or bulging.     Nose: Nose normal.     Mouth/Throat:     Mouth: Mucous membranes are moist.  Eyes:     General: No scleral icterus.       Right eye: No discharge.        Left eye: No discharge.     Extraocular Movements: Extraocular movements intact.  Cardiovascular:     Rate and Rhythm: Normal rate.  Pulmonary:      Effort: Pulmonary effort is normal.  Skin:    General: Skin is warm and dry.  Neurological:     General: No focal deficit present.     Mental Status: She is alert and oriented to person, place, and time.  Psychiatric:        Mood and Affect: Mood normal.        Behavior: Behavior normal.        Thought Content: Thought content normal.        Judgment: Judgment normal.     Assessment and Plan :   PDMP not reviewed this encounter.  1. Eustachian tube dysfunction, left   2. Colitis     Unremarkable ENT exam.  Will use conservative management for what I suspect is eustachian tube dysfunction.  Recommended starting Flonase, Zyrtec, Sudafed.  I did provide patient with a prescription for cefdinir if she has no improvement over the next 4 to 5 days, she can fill this for middle ear infection.  Regarding her GI symptoms, I suspect a standard noninfectious colitis secondary to eating fast food that she normally does not eat at all and therefore recommended supportive care.  No red flags or signs of acute abdomen.  Her symptoms are improved today and therefore recommended watchful monitoring.  Counseled patient on potential for adverse effects with medications prescribed/recommended today, ER and return-to-clinic precautions discussed, patient verbalized understanding.    Jaynee Eagles, PA-C 04/23/22 1241

## 2022-04-23 NOTE — ED Triage Notes (Signed)
Pt c/o ear fullness to left ear x 2 weeks and "a crust on the outside" x 2 months-also c/o n/v/d, chills from 12am-6am-NAD-steady gait

## 2022-05-07 ENCOUNTER — Other Ambulatory Visit: Payer: Self-pay

## 2022-05-07 DIAGNOSIS — F3341 Major depressive disorder, recurrent, in partial remission: Secondary | ICD-10-CM

## 2022-05-07 DIAGNOSIS — F419 Anxiety disorder, unspecified: Secondary | ICD-10-CM

## 2022-05-07 MED ORDER — SERTRALINE HCL 50 MG PO TABS: 125.0000 mg | ORAL_TABLET | Freq: Every day | ORAL | 2 refills | Status: DC

## 2022-07-09 NOTE — Progress Notes (Unsigned)
ACUTE VISIT No chief complaint on file.  HPI: [name] is a 62 y.o. female, who is here today complaining of *** HPI  Review of Systems See other pertinent positives and negatives in HPI.  Current Outpatient Medications on File Prior to Visit  Medication Sig Dispense Refill   buPROPion (WELLBUTRIN XL) 150 MG 24 hr tablet Take 1 tablet (150 mg total) by mouth every morning. 90 tablet 1   CALCIUM PO Take by mouth.     cefdinir (OMNICEF) 300 MG capsule Take 1 capsule (300 mg total) by mouth 2 (two) times daily. 20 capsule 0   cetirizine (ZYRTEC ALLERGY) 10 MG tablet Take 1 tablet (10 mg total) by mouth daily. 30 tablet 0   Cholecalciferol (VITAMIN D3 PO) Take by mouth.     fluticasone (FLONASE) 50 MCG/ACT nasal spray Place 2 sprays into both nostrils daily. 16 g 12   lansoprazole (PREVACID) 15 MG capsule Take 15 mg by mouth daily at 12 noon.     latanoprost (XALATAN) 0.005 % ophthalmic solution Place 1 drop into both eyes every evening.     loperamide (IMODIUM) 2 MG capsule Take 1 capsule (2 mg total) by mouth 2 (two) times daily as needed for diarrhea or loose stools. 14 capsule 0   Multiple Vitamins-Minerals (PRESERVISION AREDS PO) Take by mouth.     ondansetron (ZOFRAN-ODT) 8 MG disintegrating tablet Take 1 tablet (8 mg total) by mouth every 8 (eight) hours as needed for nausea or vomiting. 20 tablet 0   pseudoephedrine (SUDAFED) 30 MG tablet Take 1 tablet (30 mg total) by mouth every 8 (eight) hours as needed for congestion. 30 tablet 0   sertraline (ZOLOFT) 50 MG tablet Take 2.5 tablets (125 mg total) by mouth daily. 180 tablet 2   simvastatin (ZOCOR) 40 MG tablet TAKE 1 TABLET BY MOUTH EVERY DAY 90 tablet 2   No current facility-administered medications on file prior to visit.    Past Medical History:  Diagnosis Date   Chicken pox    Colon polyps    Depression    Frequent headaches    Glaucoma    Hyperlipidemia    Migraines    No Known Allergies  Social History    Socioeconomic History   Marital status: Single    Spouse name: Not on file   Number of children: Not on file   Years of education: Not on file   Highest education level: Not on file  Occupational History   Not on file  Tobacco Use   Smoking status: Never    Passive exposure: Never   Smokeless tobacco: Never  Vaping Use   Vaping Use: Never used  Substance and Sexual Activity   Alcohol use: Yes    Comment: once a month   Drug use: Never   Sexual activity: Not Currently  Other Topics Concern   Not on file  Social History Narrative   Not on file   Social Determinants of Health   Financial Resource Strain: Not on file  Food Insecurity: Not on file  Transportation Needs: Not on file  Physical Activity: Not on file  Stress: Not on file  Social Connections: Not on file    There were no vitals filed for this visit. There is no height or weight on file to calculate BMI.  Physical Exam  ASSESSMENT AND PLAN: There are no diagnoses linked to this encounter.  No follow-ups on file.  Trey Gulbranson G. [place], MD  [facility]. [place]  office.  Discharge Instructions   None

## 2022-07-10 ENCOUNTER — Encounter: Payer: Self-pay | Admitting: Family Medicine

## 2022-07-10 ENCOUNTER — Ambulatory Visit: Payer: 59 | Admitting: Family Medicine

## 2022-07-10 VITALS — BP 120/80 | HR 94 | Temp 98.7°F | Resp 16 | Ht 67.0 in | Wt 170.0 lb

## 2022-07-10 DIAGNOSIS — F3341 Major depressive disorder, recurrent, in partial remission: Secondary | ICD-10-CM

## 2022-07-10 DIAGNOSIS — L219 Seborrheic dermatitis, unspecified: Secondary | ICD-10-CM | POA: Diagnosis not present

## 2022-07-10 DIAGNOSIS — R0982 Postnasal drip: Secondary | ICD-10-CM

## 2022-07-10 DIAGNOSIS — H6062 Unspecified chronic otitis externa, left ear: Secondary | ICD-10-CM

## 2022-07-10 MED ORDER — FLUOCINOLONE ACETONIDE 0.01 % OT OIL: 1.0000 [drp] | TOPICAL_OIL | Freq: Every day | OTIC | 0 refills | Status: AC | PRN

## 2022-07-10 NOTE — Patient Instructions (Addendum)
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 Do not use My Chart to request refills or for acute issues that need immediate attention. If you send a my chart message, it may take a few days to be addressed, specially if I am not in the office.  Please be sure medication list is accurate. If a new problem present, please set up appointment sooner than planned today.

## 2022-07-11 ENCOUNTER — Encounter: Payer: Self-pay | Admitting: Family Medicine

## 2022-08-26 ENCOUNTER — Other Ambulatory Visit: Payer: Self-pay | Admitting: Family Medicine

## 2022-08-26 DIAGNOSIS — F419 Anxiety disorder, unspecified: Secondary | ICD-10-CM

## 2022-08-26 DIAGNOSIS — F3341 Major depressive disorder, recurrent, in partial remission: Secondary | ICD-10-CM

## 2022-09-18 ENCOUNTER — Other Ambulatory Visit: Payer: Self-pay

## 2022-09-18 DIAGNOSIS — F3341 Major depressive disorder, recurrent, in partial remission: Secondary | ICD-10-CM

## 2022-09-18 DIAGNOSIS — F419 Anxiety disorder, unspecified: Secondary | ICD-10-CM

## 2022-09-18 MED ORDER — BUPROPION HCL ER (XL) 150 MG PO TB24: ORAL_TABLET | ORAL | 1 refills | Status: DC

## 2022-09-18 MED ORDER — SERTRALINE HCL 100 MG PO TABS: ORAL_TABLET | ORAL | 1 refills | Status: DC

## 2022-09-18 NOTE — Addendum Note (Signed)
Addended by: Kathreen Devoid on: 09/18/2022 09:40 AM   Modules accepted: Orders

## 2022-09-27 NOTE — Progress Notes (Unsigned)
HPI: [name] is a 62 y.o. female, who is here today for her routine physical.  Last CPE: 09/25/21  Regular exercise 3 or more time per week: *** Following a healthy diet: ***  Chronic medical problems: ***  Immunization History  Administered Date(s) Administered  . Covid-19, Mrna,Vaccine(Spikevax)87yrs and older 04/16/2022  . Influenza,inj,Quad PF,6+ Mos 04/01/2022  . Moderna Sars-Covid-2 Vaccination 07/06/2019, 08/03/2019, 03/22/2020, 09/26/2020  . Tdap 05/12/2020   Health Maintenance  Topic Date Due  . HIV Screening  Never done  . Hepatitis C Screening  Never done  . Zoster Vaccines- Shingrix (1 of 2) Never done  . COVID-19 Vaccine (6 - 2023-24 season) 06/11/2022  . Colonoscopy  01/19/2023  . INFLUENZA VACCINE  12/05/2022  . MAMMOGRAM  10/05/2023  . DTaP/Tdap/Td (2 - Td or Tdap) 05/12/2030  . HPV VACCINES  Aged Out  . PAP SMEAR-Modifier  Discontinued    She has *** concerns today.  Review of Systems  Current Outpatient Medications on File Prior to Visit  Medication Sig Dispense Refill  . buPROPion (WELLBUTRIN XL) 150 MG 24 hr tablet TAKE 1 TABLET BY MOUTH EVERY DAY IN THE MORNING 90 tablet 1  . CALCIUM PO Take by mouth.    . Cholecalciferol (VITAMIN D3 PO) Take by mouth.    . Fluocinolone Acetonide 0.01 % OIL Place 1 drop in ear(s) daily as needed. 20 mL 0  . fluticasone (FLONASE) 50 MCG/ACT nasal spray Place 2 sprays into both nostrils daily. 16 g 12  . lansoprazole (PREVACID) 15 MG capsule Take 15 mg by mouth daily at 12 noon.    . latanoprost (XALATAN) 0.005 % ophthalmic solution Place 1 drop into both eyes every evening.    . Multiple Vitamins-Minerals (PRESERVISION AREDS PO) Take by mouth.    . sertraline (ZOLOFT) 100 MG tablet TAKE 1.5 TABLETS (150MG  TOTAL) BY MOUTH DAILY 135 tablet 1  . simvastatin (ZOCOR) 40 MG tablet TAKE 1 TABLET BY MOUTH EVERY DAY 90 tablet 2   No current facility-administered medications on file prior to visit.    Past  Medical History:  Diagnosis Date  . Chicken pox   . Colon polyps   . Depression   . Frequent headaches   . Glaucoma   . Hyperlipidemia   . Migraines     Past Surgical History:  Procedure Laterality Date  . ABDOMINAL HYSTERECTOMY    . APPENDECTOMY    . COLONOSCOPY  12/2015   [place]  . HEMORRHOID SURGERY    . INCONTINENCE SURGERY  2014  . POLYPECTOMY    . TONSILLECTOMY      No Known Allergies  Family History  Problem Relation Age of Onset  . Hyperlipidemia Mother   . Hearing loss Mother   . Hypertension Father   . Early death Father   . Diabetes Father   . Colon cancer Father 22  . Breast cancer Maternal Aunt        x2 60s or 15s  . Esophageal cancer Neg Hx   . Stomach cancer Neg Hx   . Rectal cancer Neg Hx     Social History   Socioeconomic History  . Marital status: Single    Spouse name: Not on file  . Number of children: Not on file  . Years of education: Not on file  . Highest education level: Not on file  Occupational History  . Not on file  Tobacco Use  . Smoking status: Never    Passive exposure: Never  .  Smokeless tobacco: Never  Vaping Use  . Vaping Use: Never used  Substance and Sexual Activity  . Alcohol use: Yes    Comment: once a month  . Drug use: Never  . Sexual activity: Not Currently  Other Topics Concern  . Not on file  Social History Narrative  . Not on file   Social Determinants of Health   Financial Resource Strain: Not on file  Food Insecurity: Not on file  Transportation Needs: Not on file  Physical Activity: Not on file  Stress: Not on file  Social Connections: Not on file    There were no vitals filed for this visit. There is no height or weight on file to calculate BMI.  Wt Readings from Last 3 Encounters:  07/10/22 170 lb (77.1 kg)  04/01/22 168 lb 6 oz (76.4 kg)  01/18/22 169 lb (76.7 kg)    Physical Exam Vitals and nursing note reviewed.  Constitutional:      General: She is not in acute distress.     Appearance: She is well-developed.  HENT:     Head: Normocephalic and atraumatic.     Right Ear: Hearing, tympanic membrane, ear canal and external ear normal.     Left Ear: Hearing, tympanic membrane, ear canal and external ear normal.     Mouth/Throat:     Mouth: Mucous membranes are moist.     Pharynx: Oropharynx is clear. Uvula midline.  Eyes:     Extraocular Movements: Extraocular movements intact.     Conjunctiva/sclera: Conjunctivae normal.     Pupils: Pupils are equal, round, and reactive to light.  Neck:     Thyroid: No thyromegaly.     Trachea: No tracheal deviation.  Cardiovascular:     Rate and Rhythm: Normal rate and regular rhythm.     Pulses:          Dorsalis pedis pulses are 2+ on the right side and 2+ on the left side.       Posterior tibial pulses are 2+ on the right side and 2+ on the left side.     Heart sounds: No murmur heard. Pulmonary:     Effort: Pulmonary effort is normal. No respiratory distress.     Breath sounds: Normal breath sounds.  Abdominal:     Palpations: Abdomen is soft. There is no hepatomegaly or mass.     Tenderness: There is no abdominal tenderness.  Genitourinary:    Comments: Deferred to gyn. Musculoskeletal:     Comments: No major deformity or signs of synovitis appreciated.  Lymphadenopathy:     Cervical: No cervical adenopathy.     Upper Body:     Right upper body: No supraclavicular adenopathy.     Left upper body: No supraclavicular adenopathy.  Skin:    General: Skin is warm.     Findings: No erythema or rash.  Neurological:     General: No focal deficit present.     Mental Status: She is alert and oriented to person, place, and time.     Cranial Nerves: No cranial nerve deficit.     Coordination: Coordination normal.     Gait: Gait normal.     Deep Tendon Reflexes:     Reflex Scores:      Bicep reflexes are 2+ on the right side and 2+ on the left side.      Patellar reflexes are 2+ on the right side and 2+ on the left  side. Psychiatric:  Comments: Well groomed, good eye contact.   ASSESSMENT AND PLAN: Ms. Desiree Doyle was here today annual physical examination.  No orders of the defined types were placed in this encounter.   There are no diagnoses linked to this encounter.  There are no diagnoses linked to this encounter.  No follow-ups on file.  Desiree Doyle G. [place], MD  [facility]. [place] office.

## 2022-10-01 ENCOUNTER — Encounter: Payer: Self-pay | Admitting: Family Medicine

## 2022-10-01 ENCOUNTER — Ambulatory Visit (INDEPENDENT_AMBULATORY_CARE_PROVIDER_SITE_OTHER): Payer: 59 | Admitting: Family Medicine

## 2022-10-01 VITALS — BP 124/80 | HR 82 | Temp 98.2°F | Resp 16 | Ht 67.0 in | Wt 170.2 lb

## 2022-10-01 DIAGNOSIS — F419 Anxiety disorder, unspecified: Secondary | ICD-10-CM

## 2022-10-01 DIAGNOSIS — Z0000 Encounter for general adult medical examination without abnormal findings: Secondary | ICD-10-CM | POA: Insufficient documentation

## 2022-10-01 DIAGNOSIS — Z23 Encounter for immunization: Secondary | ICD-10-CM | POA: Diagnosis not present

## 2022-10-01 DIAGNOSIS — Z780 Asymptomatic menopausal state: Secondary | ICD-10-CM

## 2022-10-01 DIAGNOSIS — Z1159 Encounter for screening for other viral diseases: Secondary | ICD-10-CM

## 2022-10-01 DIAGNOSIS — F3341 Major depressive disorder, recurrent, in partial remission: Secondary | ICD-10-CM

## 2022-10-01 DIAGNOSIS — E785 Hyperlipidemia, unspecified: Secondary | ICD-10-CM

## 2022-10-01 LAB — COMPREHENSIVE METABOLIC PANEL
ALT: 15 U/L (ref 0–35)
AST: 21 U/L (ref 0–37)
Albumin: 4.6 g/dL (ref 3.5–5.2)
Alkaline Phosphatase: 55 U/L (ref 39–117)
BUN: 8 mg/dL (ref 6–23)
CO2: 28 mEq/L (ref 19–32)
Calcium: 9.8 mg/dL (ref 8.4–10.5)
Chloride: 102 mEq/L (ref 96–112)
Creatinine, Ser: 0.88 mg/dL (ref 0.40–1.20)
GFR: 70.31 mL/min (ref >60.00)
Glucose, Bld: 96 mg/dL (ref 70–99)
Potassium: 3.8 mEq/L (ref 3.5–5.1)
Sodium: 140 mEq/L (ref 135–145)
Total Bilirubin: 0.5 mg/dL (ref 0.2–1.2)
Total Protein: 7.3 g/dL (ref 6.0–8.3)

## 2022-10-01 LAB — LIPID PANEL
Cholesterol: 189 mg/dL (ref 0–200)
HDL: 56.7 mg/dL (ref >39.00)
LDL Cholesterol: 93 mg/dL (ref 0–99)
NonHDL: 131.86
Total CHOL/HDL Ratio: 3
Triglycerides: 192 mg/dL — ABNORMAL HIGH (ref 0.0–149.0)
VLDL: 38.4 mg/dL (ref 0.0–40.0)

## 2022-10-01 MED ORDER — SIMVASTATIN 40 MG PO TABS: 40.0000 mg | ORAL_TABLET | Freq: Every day | ORAL | 3 refills | Status: DC

## 2022-10-01 NOTE — Assessment & Plan Note (Signed)
Stable otherwise. For now continue Sertraline 100 mg daily. F/U in 6 months.

## 2022-10-01 NOTE — Assessment & Plan Note (Addendum)
Stable. No changes in Wellbutrin XL or Sertraline dose. F/U in 6 months.

## 2022-10-01 NOTE — Assessment & Plan Note (Signed)
We discussed the importance of regular physical activity and healthy diet for prevention of chronic illness and/or complications. Preventive guidelines reviewed. Vaccination updated, Shingrix vaccine given today. Ca++ and vit D supplementation to continue. Next CPE in a year.

## 2022-10-01 NOTE — Patient Instructions (Addendum)
A few things to remember from today's visit:  Routine general medical examination at a health care facility  Hyperlipidemia, unspecified hyperlipidemia type - Plan: Comprehensive metabolic panel, Lipid panel  Anxiety disorder, unspecified type  Encounter for HCV screening test for low risk patient - Plan: Hepatitis C antibody  Asymptomatic postmenopausal estrogen deficiency - Plan: DEXAScan  If you need refills for medications you take chronically, please call your pharmacy. Do not use My Chart to request refills or for acute issues that need immediate attention. If you send a my chart message, it may take a few days to be addressed, specially if I am not in the office.  Please be sure medication list is accurate. If a new problem present, please set up appointment sooner than planned today.  Health Maintenance, Female Adopting a healthy lifestyle and getting preventive care are important in promoting health and wellness. Ask your health care provider about: The right schedule for you to have regular tests and exams. Things you can do on your own to prevent diseases and keep yourself healthy. What should I know about diet, weight, and exercise? Eat a healthy diet  Eat a diet that includes plenty of vegetables, fruits, low-fat dairy products, and lean protein. Do not eat a lot of foods that are high in solid fats, added sugars, or sodium. Maintain a healthy weight Body mass index (BMI) is used to identify weight problems. It estimates body fat based on height and weight. Your health care provider can help determine your BMI and help you achieve or maintain a healthy weight. Get regular exercise Get regular exercise. This is one of the most important things you can do for your health. Most adults should: Exercise for at least 150 minutes each week. The exercise should increase your heart rate and make you sweat (moderate-intensity exercise). Do strengthening exercises at least twice a  week. This is in addition to the moderate-intensity exercise. Spend less time sitting. Even light physical activity can be beneficial. Watch cholesterol and blood lipids Have your blood tested for lipids and cholesterol at 63 years of age, then have this test every 5 years. Have your cholesterol levels checked more often if: Your lipid or cholesterol levels are high. You are older than 63 years of age. You are at high risk for heart disease. What should I know about cancer screening? Depending on your health history and family history, you may need to have cancer screening at various ages. This may include screening for: Breast cancer. Cervical cancer. Colorectal cancer. Skin cancer. Lung cancer. What should I know about heart disease, diabetes, and high blood pressure? Blood pressure and heart disease High blood pressure causes heart disease and increases the risk of stroke. This is more likely to develop in people who have high blood pressure readings or are overweight. Have your blood pressure checked: Every 3-5 years if you are 83-61 years of age. Every year if you are 4 years old or older. Diabetes Have regular diabetes screenings. This checks your fasting blood sugar level. Have the screening done: Once every three years after age 101 if you are at a normal weight and have a low risk for diabetes. More often and at a younger age if you are overweight or have a high risk for diabetes. What should I know about preventing infection? Hepatitis B If you have a higher risk for hepatitis B, you should be screened for this virus. Talk with your health care provider to find out if you  are at risk for hepatitis B infection. Hepatitis C Testing is recommended for: Everyone born from [birth year] through 1966. Anyone with known risk factors for hepatitis C. Sexually transmitted infections (STIs) Get screened for STIs, including gonorrhea and chlamydia, if: You are sexually active and are younger  than 63 years of age. You are older than 63 years of age and your health care provider tells you that you are at risk for this type of infection. Your sexual activity has changed since you were last screened, and you are at increased risk for chlamydia or gonorrhea. Ask your health care provider if you are at risk. Ask your health care provider about whether you are at high risk for HIV. Your health care provider may recommend a prescription medicine to help prevent HIV infection. If you choose to take medicine to prevent HIV, you should first get tested for HIV. You should then be tested every 3 months for as long as you are taking the medicine. Pregnancy If you are about to stop having your period (premenopausal) and you may become pregnant, seek counseling before you get pregnant. Take 400 to 800 micrograms (mcg) of folic acid every day if you become pregnant. Ask for birth control (contraception) if you want to prevent pregnancy. Osteoporosis and menopause Osteoporosis is a disease in which the bones lose minerals and strength with aging. This can result in bone fractures. If you are 66 years old or older, or if you are at risk for osteoporosis and fractures, ask your health care provider if you should: Be screened for bone loss. Take a calcium or vitamin D supplement to lower your risk of fractures. Be given hormone replacement therapy (HRT) to treat symptoms of menopause. Follow these instructions at home: Alcohol use Do not drink alcohol if: Your health care provider tells you not to drink. You are pregnant, may be pregnant, or are planning to become pregnant. If you drink alcohol: Limit how much you have to: 0-1 drink a day. Know how much alcohol is in your drink. In the [place] one drink equals one 12 oz bottle of beer (355 mL), one 5 oz glass of wine (148 mL), or one 1 oz glass of hard liquor (44 mL). Lifestyle Do not use any products that contain nicotine or tobacco. These products  include cigarettes, chewing tobacco, and vaping devices, such as e-cigarettes. If you need help quitting, ask your health care provider. Do not use street drugs. Do not share needles. Ask your health care provider for help if you need support or information about quitting drugs. General instructions Schedule regular health, dental, and eye exams. Stay current with your vaccines. Tell your health care provider if: You often feel depressed. You have ever been abused or do not feel safe at home. Summary Adopting a healthy lifestyle and getting preventive care are important in promoting health and wellness. Follow your health care provider's instructions about healthy diet, exercising, and getting tested or screened for diseases. Follow your health care provider's instructions on monitoring your cholesterol and blood pressure. This information is not intended to replace advice given to you by your health care provider. Make sure you discuss any questions you have with your health care provider. Document Revised: 09/11/2020 Document Reviewed: 09/11/2020 Elsevier Patient Education  2024 [employer].

## 2022-10-01 NOTE — Assessment & Plan Note (Addendum)
Continue Simvastatin 40 mg daily and low fat diet. Further recommendations according to FLP results. 

## 2022-10-02 LAB — HEPATITIS C ANTIBODY: Hepatitis C Ab: NONREACTIVE

## 2023-01-09 ENCOUNTER — Encounter: Payer: Self-pay | Admitting: Gastroenterology

## 2023-01-15 ENCOUNTER — Other Ambulatory Visit: Payer: Self-pay | Admitting: Family Medicine

## 2023-01-15 DIAGNOSIS — Z1231 Encounter for screening mammogram for malignant neoplasm of breast: Secondary | ICD-10-CM

## 2023-02-03 ENCOUNTER — Ambulatory Visit: Payer: 59

## 2023-02-07 ENCOUNTER — Ambulatory Visit
Admission: RE | Admit: 2023-02-07 | Discharge: 2023-02-07 | Disposition: A | Discharge status: Home / Self Care | Payer: 59 | Source: Ambulatory Visit | Attending: Family Medicine | Admitting: Family Medicine

## 2023-02-07 DIAGNOSIS — Z1231 Encounter for screening mammogram for malignant neoplasm of breast: Secondary | ICD-10-CM

## 2023-02-26 ENCOUNTER — Ambulatory Visit
Admission: RE | Admit: 2023-02-26 | Discharge: 2023-02-26 | Disposition: A | Discharge status: Home / Self Care | Payer: 59 | Source: Ambulatory Visit | Attending: Family Medicine | Admitting: Family Medicine

## 2023-02-26 DIAGNOSIS — Z780 Asymptomatic menopausal state: Secondary | ICD-10-CM

## 2023-03-21 ENCOUNTER — Other Ambulatory Visit: Payer: Self-pay | Admitting: Family Medicine

## 2023-03-21 DIAGNOSIS — F3341 Major depressive disorder, recurrent, in partial remission: Secondary | ICD-10-CM

## 2023-03-21 DIAGNOSIS — F419 Anxiety disorder, unspecified: Secondary | ICD-10-CM

## 2023-04-08 ENCOUNTER — Encounter: Payer: Self-pay | Admitting: Family Medicine

## 2023-04-08 ENCOUNTER — Ambulatory Visit: Payer: 59 | Admitting: Family Medicine

## 2023-04-08 VITALS — BP 120/80 | HR 88 | Resp 16 | Ht 67.0 in | Wt 169.2 lb

## 2023-04-08 DIAGNOSIS — F419 Anxiety disorder, unspecified: Secondary | ICD-10-CM

## 2023-04-08 DIAGNOSIS — F3341 Major depressive disorder, recurrent, in partial remission: Secondary | ICD-10-CM

## 2023-04-08 DIAGNOSIS — B079 Viral wart, unspecified: Secondary | ICD-10-CM

## 2023-04-08 DIAGNOSIS — Z23 Encounter for immunization: Secondary | ICD-10-CM | POA: Diagnosis not present

## 2023-04-08 DIAGNOSIS — R413 Other amnesia: Secondary | ICD-10-CM | POA: Diagnosis not present

## 2023-04-08 LAB — TSH: TSH: 1.5 u[IU]/mL (ref 0.35–5.50)

## 2023-04-08 LAB — CBC
HCT: 41.6 % (ref 36.0–46.0)
Hemoglobin: 13.9 g/dL (ref 12.0–15.0)
MCHC: 33.4 g/dL (ref 30.0–36.0)
MCV: 98.5 fL (ref 78.0–100.0)
Platelets: 256 10*3/uL (ref 150.0–400.0)
RBC: 4.22 Mil/uL (ref 3.87–5.11)
RDW: 14.7 % (ref 11.5–15.5)
WBC: 5.9 10*3/uL (ref 4.0–10.5)

## 2023-04-08 LAB — VITAMIN B12: Vitamin B-12: 194 pg/mL — ABNORMAL LOW (ref 211–911)

## 2023-04-08 NOTE — Progress Notes (Signed)
HPI: Desiree Doyle is a 62 y.o. female with a PMHx significant for HLD, anxiety, and depression, who is here today for chronic disease management.  Last seen on 10/01/2022  Anxiety/depression:  Currently on Wellbutrin XL 150 mg daily and Sertraline, which she has decreased from 150 mg to 100 mg daily. She believes the medications are helping.  Even so, she says her stress level is still high. She has recently moved to a new place.  Additionally, her daughter is still having trouble finding a job and struggling financially.  She has not established with a psychotherapist since moving from [place].     04/08/2023    9:16 AM 10/01/2022    8:25 AM 07/10/2022   11:10 AM 04/01/2022    9:00 AM 09/25/2021    2:38 PM  Depression screen PHQ 2/9  Decreased Interest 1 0 1 1 0  Down, Depressed, Hopeless 1 1 0 0 0  PHQ - 2 Score 2 1 1 1  0  Altered sleeping 1 1 2 2    Tired, decreased energy 1 1 1 1    Change in appetite 0 0 0 0   Feeling bad or failure about yourself  1 0 1 0   Trouble concentrating 1 0 0 0   Moving slowly or fidgety/restless 2 0 0 0   Suicidal thoughts 0 0 0 0   PHQ-9 Score 8 3 5 4    Difficult doing work/chores Not difficult at all Somewhat difficult Not difficult at all Not difficult at all       04/08/2023    9:16 AM 10/01/2022    8:25 AM 07/10/2022   11:10 AM  GAD 7 : Generalized Anxiety Score  Nervous, Anxious, on Edge 2 1 0  Control/stop worrying 1 1 1   Worry too much - different things 1 0 1  Trouble relaxing 1 0 0  Restless 0 0 0  Easily annoyed or irritable 2 1 0  Afraid - awful might happen 1 0 0  Total GAD 7 Score 8 3 2   Anxiety Difficulty Somewhat difficult Not difficult at all Not difficult at all   Balance issues:  She says she is still having some balance issues when she gets up from sitting. She denies any falls, but says she feels like she needs to be careful.  Problem is stable.  Memory Difficulties:  She mentions her daughter has started to say she is  repeating herself. She doesn't believe she is having difficulty remembering daily things yet, but is concerned because her mother had Alzheimer's disease.  She denies unusual headaches, focal weakness,numbness, or tingling.   Recent DEXA, 02/2023, showed osteopenia with FRAX score for major Osteoporotic Fracture: 26.0% and Hip Fracture 1.2%. She has not decided about pharmacologic treatment, concerned about possible side effects. She is taking calcium and vit D supplementation.  -She asks about raised lesion on her left forearm. It is not tender, noted a while ago, she has been scratching it, trying to remove it. She has not tried OTC medications.  Review of Systems  Constitutional:  Positive for fatigue. Negative for activity change and appetite change.  HENT:  Negative for mouth sores and sore throat.   Respiratory:  Negative for cough and wheezing.   Gastrointestinal:  Negative for abdominal pain, nausea and vomiting.  Endocrine: Negative for cold intolerance and heat intolerance.  Genitourinary:  Negative for decreased urine volume, dysuria and hematuria.  Neurological:  Negative for syncope and facial asymmetry.  Psychiatric/Behavioral:  Negative  for confusion and hallucinations.   See other pertinent positives and negatives in HPI.  Current Outpatient Medications on File Prior to Visit  Medication Sig Dispense Refill   buPROPion (WELLBUTRIN XL) 150 MG 24 hr tablet TAKE 1 TABLET BY MOUTH EVERY DAY IN THE MORNING 90 tablet 1   CALCIUM PO Take by mouth.     Cholecalciferol (VITAMIN D3 PO) Take by mouth.     Fluocinolone Acetonide 0.01 % OIL Place 1 drop in ear(s) daily as needed. 20 mL 0   fluticasone (FLONASE) 50 MCG/ACT nasal spray Place 2 sprays into both nostrils daily. 16 g 12   lansoprazole (PREVACID) 15 MG capsule Take 15 mg by mouth daily at 12 noon.     latanoprost (XALATAN) 0.005 % ophthalmic solution Place 1 drop into both eyes every evening.     Multiple Vitamins-Minerals  (PRESERVISION AREDS PO) Take by mouth.     sertraline (ZOLOFT) 100 MG tablet TAKE 1.5 TABLETS (150 MG TOTAL) BY MOUTH DAILY 135 tablet 1   simvastatin (ZOCOR) 40 MG tablet Take 1 tablet (40 mg total) by mouth daily. 90 tablet 3   No current facility-administered medications on file prior to visit.    Past Medical History:  Diagnosis Date   Chicken pox    Colon polyps    Depression    Frequent headaches    Glaucoma    Hyperlipidemia    Migraines    No Known Allergies  Social History   Socioeconomic History   Marital status: Single    Spouse name: Not on file   Number of children: Not on file   Years of education: Not on file   Highest education level: Not on file  Occupational History   Not on file  Tobacco Use   Smoking status: Never    Passive exposure: Never   Smokeless tobacco: Never  Vaping Use   Vaping status: Never Used  Substance and Sexual Activity   Alcohol use: Yes    Comment: once a month   Drug use: Never   Sexual activity: Not Currently  Other Topics Concern   Not on file  Social History Narrative   Not on file   Social Determinants of Health   Financial Resource Strain: Not on file  Food Insecurity: Not on file  Transportation Needs: Not on file  Physical Activity: Not on file  Stress: Not on file  Social Connections: Not on file    Today's Vitals   04/08/23 0912  BP: 120/80  Pulse: 88  Resp: 16  SpO2: 97%  Weight: 169 lb 4 oz (76.8 kg)  Height: 5\' 7"  (1.702 m)   Body mass index is 26.51 kg/m.  Physical Exam Vitals and nursing note reviewed.  Constitutional:      General: She is not in acute distress.    Appearance: She is well-developed.  HENT:     Head: Normocephalic and atraumatic.  Eyes:     Conjunctiva/sclera: Conjunctivae normal.  Cardiovascular:     Rate and Rhythm: Normal rate and regular rhythm.     Heart sounds: No murmur heard. Pulmonary:     Effort: Pulmonary effort is normal. No respiratory distress.      Breath sounds: Normal breath sounds.  Abdominal:     Palpations: Abdomen is soft. There is no hepatomegaly or mass.     Tenderness: There is no abdominal tenderness.  Musculoskeletal:     Right lower leg: No edema.     Left lower leg:  No edema.  Lymphadenopathy:     Cervical: No cervical adenopathy.  Skin:    General: Skin is warm.     Findings: Lesion present. No erythema or rash.       Neurological:     General: No focal deficit present.     Mental Status: She is alert and oriented to person, place, and time.     Cranial Nerves: No cranial nerve deficit.     Gait: Gait normal.     Deep Tendon Reflexes:     Reflex Scores:      Patellar reflexes are 2+ on the right side and 2+ on the left side. Psychiatric:        Mood and Affect: Mood and affect normal.   ASSESSMENT AND PLAN:  Ms. Trakas was seen today for chronic follow up.   Orders Placed This Encounter  Procedures   Flu vaccine trivalent PF, 6mos and older(Flulaval,Afluria,Fluarix,Fluzone)   CBC   TSH   Vitamin B12   Lab Results  Component Value Date   TSH 1.50 04/08/2023   Lab Results  Component Value Date   WBC 5.9 04/08/2023   HGB 13.9 04/08/2023   HCT 41.6 04/08/2023   MCV 98.5 04/08/2023   PLT 256.0 04/08/2023   Lab Results  Component Value Date   VITAMINB12 194 (L) 04/08/2023   Memory difficulties Possible etiologies discussed, depression and anxiety could play a role. Neuro exam today does not suggest a serious process. A healthful diet and regular physical activity as well as cognitive challenging exercises may help. I do not think imaging is needed at this time but will consider if problem gets worse. Instructed about warning signs. Further recommendations according to lab results.  -     CBC; Future -     TSH; Future -     Vitamin B12; Future  Viral warts, unspecified type Differential Dx's discussed. Recommend avoiding scratching lesions. She would like lesion treated with liquid  nitrogen, after verbal consent lesion treated x 3. She tolerated well. Post procedure instructions given.  Recurrent major depression in partial remission (HCC) Stable. Continue Wellbutrin XL 150 mg daily and Sertraline 100 mg daily.  Anxiety disorder, unspecified type Stable, no changes after decreasing Sertraline dose, so continue 100 mg daily. Recommend establishing with provider in the area to resume CBT.  Need for influenza vaccination -     Flu vaccine trivalent PF, 6mos and older(Flulaval,Afluria,Fluarix,Fluzone)  Return in about 6 months (around 10/07/2023) for CPE.  I, Suanne Marker, acting as a scribe for Crystallynn Noorani [place], MD., have documented all relevant documentation on the behalf of Alexiya Franqui [place], MD, as directed by  Jalacia Mattila [place], MD while in the presence of Donis Pinder [place], MD.   I, Sabrina Keough [place], MD, have reviewed all documentation for this visit. The documentation on 04/10/23 for the exam, diagnosis, procedures, and orders are all accurate and complete.  Shontez Sermon G. [place], MD  [facility]. [place] office.

## 2023-04-08 NOTE — Patient Instructions (Addendum)
A few things to remember from today's visit:  Recurrent major depression in partial remission (HCC)  Anxiety disorder, unspecified type  Memory difficulties - Plan: CBC, TSH, Vitamin B12  Viral warts, unspecified type No changes today.  If you need refills for medications you take chronically, please call your pharmacy. Do not use My Chart to request refills or for acute issues that need immediate attention. If you send a my chart message, it may take a few days to be addressed, specially if I am not in the office.  Please be sure medication list is accurate. If a new problem present, please set up appointment sooner than planned today.

## 2023-04-25 ENCOUNTER — Ambulatory Visit (INDEPENDENT_AMBULATORY_CARE_PROVIDER_SITE_OTHER): Payer: 59

## 2023-04-25 DIAGNOSIS — E538 Deficiency of other specified B group vitamins: Secondary | ICD-10-CM

## 2023-04-25 MED ORDER — CYANOCOBALAMIN 1000 MCG/ML IJ SOLN
1000.0000 ug | Freq: Once | INTRAMUSCULAR | Status: AC
  Administered 2023-04-25: 1000 ug via INTRAMUSCULAR

## 2023-04-25 NOTE — Progress Notes (Signed)
Per orders of Dr. [place], injection of B12 given by Vickii Chafe on Left Deltoid. Patient tolerated injection well.

## 2023-05-02 ENCOUNTER — Ambulatory Visit (INDEPENDENT_AMBULATORY_CARE_PROVIDER_SITE_OTHER): Payer: 59

## 2023-05-02 DIAGNOSIS — E538 Deficiency of other specified B group vitamins: Secondary | ICD-10-CM

## 2023-05-02 MED ORDER — CYANOCOBALAMIN 1000 MCG/ML IJ SOLN
1000.0000 ug | Freq: Once | INTRAMUSCULAR | Status: AC
  Administered 2023-05-02: 1000 ug via INTRAMUSCULAR

## 2023-05-02 NOTE — Progress Notes (Signed)
Per orders of Shirline Frees, NP, injection of B12 given by Vickii Chafe on Left Deltoid.  Patient tolerated injection well.

## 2023-05-09 ENCOUNTER — Ambulatory Visit (INDEPENDENT_AMBULATORY_CARE_PROVIDER_SITE_OTHER): Payer: 59

## 2023-05-09 DIAGNOSIS — E538 Deficiency of other specified B group vitamins: Secondary | ICD-10-CM | POA: Diagnosis not present

## 2023-05-09 MED ORDER — CYANOCOBALAMIN 1000 MCG/ML IJ SOLN
1000.0000 ug | Freq: Once | INTRAMUSCULAR | Status: AC
  Administered 2023-05-09: 1000 ug via INTRAMUSCULAR

## 2023-05-09 NOTE — Progress Notes (Signed)
 Per orders of Dr. Casimiro Needle, injection of B12 given by Vickii Chafe on Left Deltoid. Patient tolerated injection well.

## 2023-05-16 ENCOUNTER — Ambulatory Visit (INDEPENDENT_AMBULATORY_CARE_PROVIDER_SITE_OTHER): Payer: 59

## 2023-05-16 DIAGNOSIS — E538 Deficiency of other specified B group vitamins: Secondary | ICD-10-CM

## 2023-05-16 MED ORDER — CYANOCOBALAMIN 1000 MCG/ML IJ SOLN
1000.0000 ug | Freq: Once | INTRAMUSCULAR | Status: AC
  Administered 2023-05-16: 1000 ug via INTRAMUSCULAR

## 2023-05-16 NOTE — Progress Notes (Signed)
Per orders of Dr. [place], injection of B12 given by Vickii Chafe on Left Deltoid. Patient tolerated injection well.

## 2023-06-16 ENCOUNTER — Ambulatory Visit (INDEPENDENT_AMBULATORY_CARE_PROVIDER_SITE_OTHER): Payer: 59

## 2023-06-16 DIAGNOSIS — E538 Deficiency of other specified B group vitamins: Secondary | ICD-10-CM

## 2023-06-16 MED ORDER — CYANOCOBALAMIN 1000 MCG/ML IJ SOLN
1000.0000 ug | Freq: Once | INTRAMUSCULAR | Status: AC
  Administered 2023-06-16: 1000 ug via INTRAMUSCULAR

## 2023-06-16 NOTE — Progress Notes (Signed)
Pt here for monthly B12 injection per Dr. Jordan.  B12 1000mcg given IM and pt tolerated injection well.   

## 2023-07-31 ENCOUNTER — Ambulatory Visit: Admitting: Internal Medicine

## 2023-07-31 ENCOUNTER — Encounter: Payer: Self-pay | Admitting: Internal Medicine

## 2023-07-31 VITALS — BP 162/90 | HR 91 | Temp 98.1°F | Wt 165.7 lb

## 2023-07-31 DIAGNOSIS — G8929 Other chronic pain: Secondary | ICD-10-CM | POA: Diagnosis not present

## 2023-07-31 DIAGNOSIS — M5442 Lumbago with sciatica, left side: Secondary | ICD-10-CM | POA: Diagnosis not present

## 2023-07-31 MED ORDER — MELOXICAM 7.5 MG PO TABS: 7.5000 mg | ORAL_TABLET | Freq: Every day | ORAL | 0 refills | Status: DC

## 2023-07-31 NOTE — Progress Notes (Signed)
 Established Patient Office Visit     CC/Reason for Visit: Neck pain  HPI: Desiree Doyle is a 63 y.o. female who is coming in today for the above mentioned reasons.  She has suffered with back pain for years.  In 2020, while living in [place],  had severe low back pain with left-sided radiculopathy, had an MRI and was diagnosed with "bulging disks" and per her description it would appear she had an epidural injection with significant relief.  She has been doing well until the last couple of weeks when she bent down to pick something off the floor and all of a sudden has had again severe back pain radiating down into her left leg.  It is very painful to the point where she is in tears today.  She has tried using over-the-counter pain relievers without much success.   Past Medical/Surgical History: Past Medical History:  Diagnosis Date   Chicken pox    Colon polyps    Depression    Frequent headaches    Glaucoma    Hyperlipidemia    Migraines     Past Surgical History:  Procedure Laterality Date   ABDOMINAL HYSTERECTOMY     APPENDECTOMY     COLONOSCOPY  12/2015   [place]   HEMORRHOID SURGERY     INCONTINENCE SURGERY  2014   POLYPECTOMY     TONSILLECTOMY      Social History:  reports that she has never smoked. She has never been exposed to tobacco smoke. She has never used smokeless tobacco. She reports current alcohol use. She reports that she does not use drugs.  Allergies: No Known Allergies  Family History:  Family History  Problem Relation Age of Onset   Hyperlipidemia Mother    Hearing loss Mother    Hypertension Father    Early death Father    Diabetes Father    Colon cancer Father 36   Breast cancer Maternal Aunt        x2 74s or 106s   Esophageal cancer Neg Hx    Stomach cancer Neg Hx    Rectal cancer Neg Hx      Current Outpatient Medications:    buPROPion (WELLBUTRIN XL) 150 MG 24 hr tablet, TAKE 1 TABLET BY MOUTH EVERY DAY IN THE MORNING, Disp: 90  tablet, Rfl: 1   CALCIUM PO, Take by mouth., Disp: , Rfl:    Cholecalciferol (VITAMIN D3 PO), Take by mouth., Disp: , Rfl:    Fluocinolone Acetonide 0.01 % OIL, Place 1 drop in ear(s) daily as needed., Disp: 20 mL, Rfl: 0   fluticasone (FLONASE) 50 MCG/ACT nasal spray, Place 2 sprays into both nostrils daily., Disp: 16 g, Rfl: 12   lansoprazole (PREVACID) 15 MG capsule, Take 15 mg by mouth daily at 12 noon., Disp: , Rfl:    latanoprost (XALATAN) 0.005 % ophthalmic solution, Place 1 drop into both eyes every evening., Disp: , Rfl:    meloxicam (MOBIC) 7.5 MG tablet, Take 1 tablet (7.5 mg total) by mouth daily., Disp: 30 tablet, Rfl: 0   Multiple Vitamins-Minerals (PRESERVISION AREDS PO), Take by mouth., Disp: , Rfl:    sertraline (ZOLOFT) 100 MG tablet, TAKE 1.5 TABLETS (150 MG TOTAL) BY MOUTH DAILY, Disp: 135 tablet, Rfl: 1   simvastatin (ZOCOR) 40 MG tablet, Take 1 tablet (40 mg total) by mouth daily., Disp: 90 tablet, Rfl: 3  Review of Systems:  Negative unless indicated in HPI.   Physical Exam: Vitals:  07/31/23 1340 07/31/23 1343  BP: (!) 170/90 (!) 162/90  Pulse: 91   Temp: 98.1 F (36.7 C)   TempSrc: Oral   SpO2: 97%   Weight: 165 lb 11.2 oz (75.2 kg)     Body mass index is 25.95 kg/m.    Impression and Plan:  Chronic bilateral low back pain with left-sided sciatica -     Meloxicam; Take 1 tablet (7.5 mg total) by mouth daily.  Dispense: 30 tablet; Refill: 0 -     MR LUMBAR SPINE WO CONTRAST; Future -     Ambulatory referral to Orthopedic Surgery   -Given her history, I think it is important to redefine her lumbar anatomy, will order MRI of the lumbar spine, place referral to orthopedics as well as send meloxicam for pain relief.  Time spent:31 minutes reviewing chart, interviewing and examining patient and formulating plan of care.     Chaya Jan, MD [facility]

## 2023-08-07 ENCOUNTER — Ambulatory Visit (HOSPITAL_BASED_OUTPATIENT_CLINIC_OR_DEPARTMENT_OTHER)
Admission: RE | Admit: 2023-08-07 | Discharge: 2023-08-07 | Disposition: A | Discharge status: Home / Self Care | Source: Ambulatory Visit | Attending: Internal Medicine | Admitting: Internal Medicine

## 2023-08-07 DIAGNOSIS — G8929 Other chronic pain: Secondary | ICD-10-CM | POA: Diagnosis present

## 2023-08-07 DIAGNOSIS — M5442 Lumbago with sciatica, left side: Secondary | ICD-10-CM | POA: Insufficient documentation

## 2023-09-01 ENCOUNTER — Other Ambulatory Visit: Payer: Self-pay | Admitting: Internal Medicine

## 2023-09-01 DIAGNOSIS — G8929 Other chronic pain: Secondary | ICD-10-CM

## 2023-09-07 ENCOUNTER — Other Ambulatory Visit: Payer: Self-pay | Admitting: Internal Medicine

## 2023-09-07 DIAGNOSIS — G8929 Other chronic pain: Secondary | ICD-10-CM

## 2023-09-12 ENCOUNTER — Ambulatory Visit (HOSPITAL_BASED_OUTPATIENT_CLINIC_OR_DEPARTMENT_OTHER)
Admission: RE | Admit: 2023-09-12 | Discharge: 2023-09-12 | Disposition: A | Discharge status: Home / Self Care | Source: Ambulatory Visit | Attending: Internal Medicine | Admitting: Internal Medicine

## 2023-09-12 DIAGNOSIS — M5442 Lumbago with sciatica, left side: Secondary | ICD-10-CM | POA: Diagnosis present

## 2023-09-12 DIAGNOSIS — G8929 Other chronic pain: Secondary | ICD-10-CM | POA: Insufficient documentation

## 2023-09-26 ENCOUNTER — Other Ambulatory Visit: Payer: Self-pay | Admitting: Family Medicine

## 2023-09-26 DIAGNOSIS — F419 Anxiety disorder, unspecified: Secondary | ICD-10-CM

## 2023-09-26 DIAGNOSIS — F3341 Major depressive disorder, recurrent, in partial remission: Secondary | ICD-10-CM

## 2023-10-06 ENCOUNTER — Other Ambulatory Visit: Payer: Self-pay | Admitting: Family Medicine

## 2023-10-06 DIAGNOSIS — E785 Hyperlipidemia, unspecified: Secondary | ICD-10-CM

## 2023-10-06 NOTE — Progress Notes (Signed)
 HPI: [name] is a 63 y.o. female with a PMHx significant for HLD, osteoporosis, anxiety, and depression, who is here today for her routine physical.  Last CPE: 10/01/2022.   Exercise: Patient states she is walking her dog 3 times per day but says it is not a brisk walk.  Diet: She cooks at home and eats vegetables daily. Mentions her appetite is slightly decreased. She eats chicken for protein.  Sleep: Reports ~8 hours of sleep per night.  Alcohol Use: rarely, maybe once per month Smoking: never Vision: UTD on routine vision care.  Dental: UTD on routine dental care.   Immunization History  Administered Date(s) Administered  . Influenza, Seasonal, Injecte, Preservative Fre 04/08/2023  . Influenza,inj,Quad PF,6+ Mos 04/01/2022  . Moderna Covid-19 Fall Seasonal Vaccine 41yrs & older 04/16/2022  . Moderna Sars-Covid-2 Vaccination 07/06/2019, 08/03/2019, 03/22/2020, 09/26/2020  . Tdap 05/12/2020  . Zoster Recombinant(Shingrix ) 10/01/2022   Health Maintenance  Topic Date Due  . Cervical Cancer Screening (HPV/Pap Cotest)  06/01/2018  . Zoster Vaccines- Shingrix  (2 of 2) 11/26/2022  . COVID-19 Vaccine (6 - 2024-25 season) 01/05/2023  . Colonoscopy  01/19/2023  . HIV Screening  09/30/2028 (Originally 01/30/1976)  . INFLUENZA VACCINE  12/05/2023  . MAMMOGRAM  02/06/2025  . DTaP/Tdap/Td (2 - Td or Tdap) 05/12/2030  . Hepatitis C Screening  Completed  . HPV VACCINES  Aged Out  . Meningococcal B Vaccine  Aged Out   Chronic medical problems:   Hyperlipidemia: Currently on simvastatin  40 mg daily.   Lab Results  Component Value Date   CHOL 189 10/01/2022   HDL 56.70 10/01/2022   LDLCALC 93 10/01/2022   TRIG 192.0 (H) 10/01/2022   CHOLHDL 3 10/01/2022   Anxiety/depression:  Currently on Bupropion  150 mg daily and Sertraline  100 mg daily.  She says her mood is fine and believes her medications are helping.   Osteoporosis:  She is not on pharmacologic  treatment. Mentions she has a history of dental problems, so afraid of starting any medication. She takes calcium and vitamin D  supplementation.  Last DEXA scan 02/26/2023.  Since her last visit, 04/08/23,she saw orthopedics, on 08/18/23, for low back pain. She had a thoracic MRI on 5/9 but has not heard back yet.  She has a history of back pain, and says it flares up occasionally.   She mentions she is no longer taking B12 supplementation.   Review of Systems  Constitutional:  Negative for activity change, appetite change and fever.  HENT:  Negative for mouth sores, sore throat and trouble swallowing.   Eyes:  Negative for redness and visual disturbance.  Respiratory:  Negative for cough, shortness of breath and wheezing.   Cardiovascular:  Negative for chest pain and leg swelling.  Gastrointestinal:  Negative for abdominal pain, nausea and vomiting.       No changes in bowel habits.  Endocrine: Negative for cold intolerance, heat intolerance, polydipsia, polyphagia and polyuria.  Genitourinary:  Negative for decreased urine volume, dysuria, hematuria, vaginal bleeding and vaginal discharge.  Musculoskeletal:  Negative for gait problem and myalgias.  Skin:  Negative for color change and rash.  Allergic/Immunologic: Positive for environmental allergies.  Neurological:  Negative for syncope, weakness and headaches.  Hematological:  Negative for adenopathy. Does not bruise/bleed easily.  Psychiatric/Behavioral:  Negative for confusion and hallucinations.   All other systems reviewed and are negative.  Current Outpatient Medications on File Prior to Visit  Medication Sig Dispense Refill  . buPROPion  (WELLBUTRIN  XL) 150  MG 24 hr tablet TAKE 1 TABLET BY MOUTH EVERY DAY IN THE MORNING 90 tablet 1  . CALCIUM PO Take by mouth.    . Cholecalciferol (VITAMIN D3 PO) Take by mouth.    . Fluocinolone  Acetonide 0.01 % OIL Place 1 drop in ear(s) daily as needed. 20 mL 0  . fluticasone  (FLONASE ) 50  MCG/ACT nasal spray Place 2 sprays into both nostrils daily. 16 g 12  . lansoprazole (PREVACID) 15 MG capsule Take 15 mg by mouth daily at 12 noon.    . latanoprost (XALATAN) 0.005 % ophthalmic solution Place 1 drop into both eyes every evening.    . meloxicam  (MOBIC ) 7.5 MG tablet Take 1 tablet (7.5 mg total) by mouth daily. 30 tablet 0  . Multiple Vitamins-Minerals (PRESERVISION AREDS PO) Take by mouth.    . sertraline  (ZOLOFT ) 100 MG tablet TAKE 1.5 TABLETS (150 MG TOTAL) BY MOUTH DAILY 135 tablet 1  . simvastatin  (ZOCOR ) 40 MG tablet Take 1 tablet (40 mg total) by mouth daily. 90 tablet 3   No current facility-administered medications on file prior to visit.   Past Medical History:  Diagnosis Date  . Chicken pox   . Colon polyps   . Depression   . Frequent headaches   . Glaucoma   . Hyperlipidemia   . Migraines    Past Surgical History:  Procedure Laterality Date  . ABDOMINAL HYSTERECTOMY    . APPENDECTOMY    . COLONOSCOPY  12/2015   [place]   . HEMORRHOID SURGERY    . INCONTINENCE SURGERY  2014  . POLYPECTOMY    . TONSILLECTOMY     No Known Allergies  Family History  Problem Relation Age of Onset  . Hyperlipidemia Mother   . Hearing loss Mother   . Hypertension Father   . Early death Father   . Diabetes Father   . Colon cancer Father 53  . Breast cancer Maternal Aunt        x2 60s or 12s  . Esophageal cancer Neg Hx   . Stomach cancer Neg Hx   . Rectal cancer Neg Hx    Social History   Socioeconomic History  . Marital status: Single    Spouse name: Not on file  . Number of children: Not on file  . Years of education: Not on file  . Highest education level: Master's degree (e.g., MA, MS, MEng, MEd, MSW, MBA)  Occupational History  . Not on file  Tobacco Use  . Smoking status: Never    Passive exposure: Never  . Smokeless tobacco: Never  Vaping Use  . Vaping status: Never Used  Substance and Sexual Activity  . Alcohol use: Yes    Comment: once a month   . Drug use: Never  . Sexual activity: Not Currently  Other Topics Concern  . Not on file  Social History Narrative  . Not on file   Social Drivers of Health   Financial Resource Strain: Low Risk  (10/06/2023)   Overall Financial Resource Strain (CARDIA)   . Difficulty of Paying Living Expenses: Not very hard  Food Insecurity: Food Insecurity Present (10/06/2023)   Hunger Vital Sign   . Worried About Programme researcher, broadcasting/film/video in the Last Year: Sometimes true   . Ran Out of Food in the Last Year: Sometimes true  Transportation Needs: No Transportation Needs (10/06/2023)   PRAPARE - Transportation   . Lack of Transportation (Medical): No   . Lack of Transportation (Non-Medical): No  Physical Activity: Insufficiently Active (10/06/2023)   Exercise Vital Sign   . Days of Exercise per Week: 2 days   . Minutes of Exercise per Session: 10 min  Stress: No Stress Concern Present (10/06/2023)   [employer] of Occupational Health - Occupational Stress Questionnaire   . Feeling of Stress : Only a little  Social Connections: Moderately Isolated (10/06/2023)   Social Connection and Isolation Panel [NHANES]   . Frequency of Communication with Friends and Family: Twice a week   . Frequency of Social Gatherings with Friends and Family: Twice a week   . Attends Religious Services: 1 to 4 times per year   . Active Member of Clubs or Organizations: No   . Attends Banker Meetings: Not on file   . Marital Status: Never married   Today's Vitals   10/07/23 0750  BP: 120/70  Pulse: 90  Resp: 16  Temp: 98.4 F (36.9 C)  TempSrc: Oral  SpO2: 95%  Weight: 165 lb 4 oz (75 kg)  Height: 5\' 7"  (1.702 m)   Body mass index is 25.88 kg/m. Wt Readings from Last 3 Encounters:  10/07/23 165 lb 4 oz (75 kg)  07/31/23 165 lb 11.2 oz (75.2 kg)  04/08/23 169 lb 4 oz (76.8 kg)   Physical Exam Vitals and nursing note reviewed.  Constitutional:      General: She is not in acute distress.     Appearance: She is well-developed.  HENT:     Head: Normocephalic and atraumatic.     Right Ear: External ear normal. Tympanic membrane is not erythematous.     Left Ear: External ear normal. Tympanic membrane is not erythematous.     Ears:     Comments: Excess cerumen in ear canals, unable to see tympanic membranes partially, bilateral.    Mouth/Throat:     Mouth: Mucous membranes are moist.     Pharynx: Oropharynx is clear. Uvula midline.  Eyes:     Extraocular Movements: Extraocular movements intact.     Conjunctiva/sclera: Conjunctivae normal.     Pupils: Pupils are equal, round, and reactive to light.  Neck:     Thyroid : No thyroid  mass or thyromegaly.  Cardiovascular:     Rate and Rhythm: Normal rate and regular rhythm.     Pulses:          Dorsalis pedis pulses are 2+ on the right side and 2+ on the left side.     Heart sounds: No murmur heard. Pulmonary:     Effort: Pulmonary effort is normal. No respiratory distress.     Breath sounds: Normal breath sounds.  Abdominal:     Palpations: Abdomen is soft. There is no hepatomegaly or mass.     Tenderness: There is no abdominal tenderness.  Genitourinary:    Comments: No concerns. Musculoskeletal:     Right lower leg: No edema.     Left lower leg: No edema.     Comments: No signs of synovitis appreciated.  Lymphadenopathy:     Cervical: No cervical adenopathy.     Upper Body:     Right upper body: No supraclavicular adenopathy.     Left upper body: No supraclavicular adenopathy.  Skin:    General: Skin is warm.     Findings: No erythema or rash.  Neurological:     General: No focal deficit present.     Mental Status: She is alert and oriented to person, place, and time.     Cranial  Nerves: No cranial nerve deficit.     Sensory: No sensory deficit.     Motor: No weakness.     Coordination: Coordination normal.     Gait: Gait normal.     Deep Tendon Reflexes:     Reflex Scores:      Bicep reflexes are 2+ on the  right side and 2+ on the left side.      Patellar reflexes are 2+ on the right side and 2+ on the left side. Psychiatric:        Mood and Affect: Mood and affect normal.  ASSESSMENT AND PLAN:  Ms. Idara Woodside was here today for her annual physical examination. Lab Results  Component Value Date   NA 141 10/07/2023   CL 105 10/07/2023   K 3.4 (L) 10/07/2023   CO2 28 10/07/2023   BUN 9 10/07/2023   CREATININE 0.97 10/07/2023   GFR 62.11 10/07/2023   CALCIUM 9.7 10/07/2023   ALBUMIN 4.6 10/07/2023   GLUCOSE 98 10/07/2023   Lab Results  Component Value Date   ALT 15 10/07/2023   AST 20 10/07/2023   ALKPHOS 64 10/07/2023   BILITOT 0.4 10/07/2023   Lab Results  Component Value Date   VITAMINB12 382 10/07/2023   Lab Results  Component Value Date   CHOL 175 10/07/2023   HDL 58.20 10/07/2023   LDLCALC 93 10/07/2023   TRIG 117.0 10/07/2023   CHOLHDL 3 10/07/2023   Lab Results  Component Value Date   VD25OH 42.41 10/07/2023   Routine general medical examination at a health care facility Assessment & Plan: We discussed the importance of regular physical activity and healthy diet for prevention of chronic illness and/or complications. Preventive guidelines reviewed. Vaccination: Shingrix  has been completed, prefers to hold on Prevnar 20. Ca++ and vit D supplementation to continue. Status post hysterectomy due to bleeding, no malignancy. Next CPE in a year.   Hyperlipidemia, unspecified hyperlipidemia type Assessment & Plan: Continue simvastatin  40 mg daily and low-fat diet. Further recommendation will be given according to lipid panel result.  Orders: -     Comprehensive metabolic panel with GFR; Future -     Lipid panel; Future  Screening for endocrine, metabolic and immunity disorder -     Comprehensive metabolic panel with GFR; Future  Localized osteoporosis without current pathological fracture Assessment & Plan: We discussed DEXA results. For now she  prefers to hold on pharmacologic treatment, concerned about possible side effects. Continue adequate calcium and vitamin D  intake, fall precautions, and regular exercise that includes weightbearing exercises at least 3 times per week. We will plan on repeating bone density in 2 years.  Orders: -     VITAMIN D  25 Hydroxy (Vit-D Deficiency, Fractures); Future  B12 deficiency Assessment & Plan: Currently she is not on B12 supplementation. Further recommendation will be given according to B12 result.  Orders: -     Vitamin B12; Future  Recurrent major depression in partial remission [facility]) Assessment & Plan: Problem is stable. Continue Wellbutrin  XL 150 mg daily and sertraline  100 mg daily. As far as problem is stable, annual follow-up is appropriate.   Anxiety disorder, unspecified type Assessment & Plan: Problem is stable. Current.  Sertraline  100 mg daily. Follow-up in a year, before if needed.  Orders: -     Sertraline  HCl; Take 1 tablet (100 mg total) by mouth daily.  Dispense: 90 tablet; Refill: 2   Return in 1 year (on 10/06/2024) for CPE, chronic problems, Labs.  I, Fritz Jewel Wierda, acting as a scribe for Rosaire Cueto [place], MD., have documented all relevant documentation on the behalf of Fayelynn Distel [place], MD, as directed by  Jashanti Clinkscale [place], MD while in the presence of Wanda Cellucci [place], MD.   I,  Pardini [place], MD, have reviewed all documentation for this visit. The documentation on 10/06/23 for the exam, diagnosis, procedures, and orders are all accurate and complete.  Autym Siess G. [place], MD  [facility]. [place] office.

## 2023-10-07 ENCOUNTER — Encounter: Payer: Self-pay | Admitting: Family Medicine

## 2023-10-07 ENCOUNTER — Ambulatory Visit (INDEPENDENT_AMBULATORY_CARE_PROVIDER_SITE_OTHER): Payer: 59 | Admitting: Family Medicine

## 2023-10-07 ENCOUNTER — Ambulatory Visit: Payer: Self-pay | Admitting: Family Medicine

## 2023-10-07 VITALS — BP 120/70 | HR 90 | Temp 98.4°F | Resp 16 | Ht 67.0 in | Wt 165.2 lb

## 2023-10-07 DIAGNOSIS — F419 Anxiety disorder, unspecified: Secondary | ICD-10-CM

## 2023-10-07 DIAGNOSIS — Z130 Encounter for screening for diseases of the blood and blood-forming organs and certain disorders involving the immune mechanism: Secondary | ICD-10-CM | POA: Diagnosis not present

## 2023-10-07 DIAGNOSIS — Z0000 Encounter for general adult medical examination without abnormal findings: Secondary | ICD-10-CM

## 2023-10-07 DIAGNOSIS — M816 Localized osteoporosis [Lequesne]: Secondary | ICD-10-CM

## 2023-10-07 DIAGNOSIS — M810 Age-related osteoporosis without current pathological fracture: Secondary | ICD-10-CM | POA: Insufficient documentation

## 2023-10-07 DIAGNOSIS — F3341 Major depressive disorder, recurrent, in partial remission: Secondary | ICD-10-CM

## 2023-10-07 DIAGNOSIS — E785 Hyperlipidemia, unspecified: Secondary | ICD-10-CM | POA: Diagnosis not present

## 2023-10-07 DIAGNOSIS — Z1329 Encounter for screening for other suspected endocrine disorder: Secondary | ICD-10-CM | POA: Diagnosis not present

## 2023-10-07 DIAGNOSIS — E538 Deficiency of other specified B group vitamins: Secondary | ICD-10-CM | POA: Insufficient documentation

## 2023-10-07 DIAGNOSIS — Z13228 Encounter for screening for other metabolic disorders: Secondary | ICD-10-CM | POA: Diagnosis not present

## 2023-10-07 LAB — COMPREHENSIVE METABOLIC PANEL WITH GFR
ALT: 15 U/L (ref 0–35)
AST: 20 U/L (ref 0–37)
Albumin: 4.6 g/dL (ref 3.5–5.2)
Alkaline Phosphatase: 64 U/L (ref 39–117)
BUN: 9 mg/dL (ref 6–23)
CO2: 28 meq/L (ref 19–32)
Calcium: 9.7 mg/dL (ref 8.4–10.5)
Chloride: 105 meq/L (ref 96–112)
Creatinine, Ser: 0.97 mg/dL (ref 0.40–1.20)
GFR: 62.11 mL/min (ref >60.00)
Glucose, Bld: 98 mg/dL (ref 70–99)
Potassium: 3.4 meq/L — ABNORMAL LOW (ref 3.5–5.1)
Sodium: 141 meq/L (ref 135–145)
Total Bilirubin: 0.4 mg/dL (ref 0.2–1.2)
Total Protein: 7 g/dL (ref 6.0–8.3)

## 2023-10-07 LAB — LIPID PANEL
Cholesterol: 175 mg/dL (ref 0–200)
HDL: 58.2 mg/dL (ref >39.00)
LDL Cholesterol: 93 mg/dL (ref 0–99)
NonHDL: 116.69
Total CHOL/HDL Ratio: 3
Triglycerides: 117 mg/dL (ref 0.0–149.0)
VLDL: 23.4 mg/dL (ref 0.0–40.0)

## 2023-10-07 LAB — VITAMIN B12: Vitamin B-12: 382 pg/mL (ref 211–911)

## 2023-10-07 LAB — VITAMIN D 25 HYDROXY (VIT D DEFICIENCY, FRACTURES): VITD: 42.41 ng/mL (ref 30.00–100.00)

## 2023-10-07 MED ORDER — SERTRALINE HCL 100 MG PO TABS: 100.0000 mg | ORAL_TABLET | Freq: Every day | ORAL | 2 refills | Status: AC

## 2023-10-07 NOTE — Patient Instructions (Addendum)
 A few things to remember from today's visit:  Routine general medical examination at a health care facility  Hyperlipidemia, unspecified hyperlipidemia type - Plan: Comprehensive metabolic panel with GFR, Lipid panel  Colon cancer screening  Screening for endocrine, metabolic and immunity disorder - Plan: Comprehensive metabolic panel with GFR  Localized osteoporosis without current pathological fracture - Plan: VITAMIN D  25 Hydroxy (Vit-D Deficiency, Fractures)  B12 deficiency - Plan: Vitamin B12 Arrange colonoscopy when you have transportation. Let me know about Fosamax.  If you need refills for medications you take chronically, please call your pharmacy. Do not use My Chart to request refills or for acute issues that need immediate attention. If you send a my chart message, it may take a few days to be addressed, specially if I am not in the office.  Please be sure medication list is accurate. If a new problem present, please set up appointment sooner than planned today.  Health Maintenance, Female Adopting a healthy lifestyle and getting preventive care are important in promoting health and wellness. Ask your health care provider about: The right schedule for you to have regular tests and exams. Things you can do on your own to prevent diseases and keep yourself healthy. What should I know about diet, weight, and exercise? Eat a healthy diet  Eat a diet that includes plenty of vegetables, fruits, low-fat dairy products, and lean protein. Do not eat a lot of foods that are high in solid fats, added sugars, or sodium. Maintain a healthy weight Body mass index (BMI) is used to identify weight problems. It estimates body fat based on height and weight. Your health care provider can help determine your BMI and help you achieve or maintain a healthy weight. Get regular exercise Get regular exercise. This is one of the most important things you can do for your health. Most adults  should: Exercise for at least 150 minutes each week. The exercise should increase your heart rate and make you sweat (moderate-intensity exercise). Do strengthening exercises at least twice a week. This is in addition to the moderate-intensity exercise. Spend less time sitting. Even light physical activity can be beneficial. Watch cholesterol and blood lipids Have your blood tested for lipids and cholesterol at 64 years of age, then have this test every 5 years. Have your cholesterol levels checked more often if: Your lipid or cholesterol levels are high. You are older than 64 years of age. You are at high risk for heart disease. What should I know about cancer screening? Depending on your health history and family history, you may need to have cancer screening at various ages. This may include screening for: Breast cancer. Cervical cancer. Colorectal cancer. Skin cancer. Lung cancer. What should I know about heart disease, diabetes, and high blood pressure? Blood pressure and heart disease High blood pressure causes heart disease and increases the risk of stroke. This is more likely to develop in people who have high blood pressure readings or are overweight. Have your blood pressure checked: Every 3-5 years if you are 41-25 years of age. Every year if you are 70 years old or older. Diabetes Have regular diabetes screenings. This checks your fasting blood sugar level. Have the screening done: Once every three years after age 41 if you are at a normal weight and have a low risk for diabetes. More often and at a younger age if you are overweight or have a high risk for diabetes. What should I know about preventing infection? Hepatitis B  If you have a higher risk for hepatitis B, you should be screened for this virus. Talk with your health care provider to find out if you are at risk for hepatitis B infection. Hepatitis C Testing is recommended for: Everyone born from [birth year] through  1965. Anyone with known risk factors for hepatitis C. Sexually transmitted infections (STIs) Get screened for STIs, including gonorrhea and chlamydia, if: You are sexually active and are younger than 64 years of age. You are older than 64 years of age and your health care provider tells you that you are at risk for this type of infection. Your sexual activity has changed since you were last screened, and you are at increased risk for chlamydia or gonorrhea. Ask your health care provider if you are at risk. Ask your health care provider about whether you are at high risk for HIV. Your health care provider may recommend a prescription medicine to help prevent HIV infection. If you choose to take medicine to prevent HIV, you should first get tested for HIV. You should then be tested every 3 months for as long as you are taking the medicine. Pregnancy If you are about to stop having your period (premenopausal) and you may become pregnant, seek counseling before you get pregnant. Take 400 to 800 micrograms (mcg) of folic acid every day if you become pregnant. Ask for birth control (contraception) if you want to prevent pregnancy. Osteoporosis and menopause Osteoporosis is a disease in which the bones lose minerals and strength with aging. This can result in bone fractures. If you are 51 years old or older, or if you are at risk for osteoporosis and fractures, ask your health care provider if you should: Be screened for bone loss. Take a calcium or vitamin D  supplement to lower your risk of fractures. Be given hormone replacement therapy (HRT) to treat symptoms of menopause. Follow these instructions at home: Alcohol use Do not drink alcohol if: Your health care provider tells you not to drink. You are pregnant, may be pregnant, or are planning to become pregnant. If you drink alcohol: Limit how much you have to: 0-1 drink a day. Know how much alcohol is in your drink. In the [place] one drink  equals one 12 oz bottle of beer (355 mL), one 5 oz glass of wine (148 mL), or one 1 oz glass of hard liquor (44 mL). Lifestyle Do not use any products that contain nicotine or tobacco. These products include cigarettes, chewing tobacco, and vaping devices, such as e-cigarettes. If you need help quitting, ask your health care provider. Do not use street drugs. Do not share needles. Ask your health care provider for help if you need support or information about quitting drugs. General instructions Schedule regular health, dental, and eye exams. Stay current with your vaccines. Tell your health care provider if: You often feel depressed. You have ever been abused or do not feel safe at home. Summary Adopting a healthy lifestyle and getting preventive care are important in promoting health and wellness. Follow your health care provider's instructions about healthy diet, exercising, and getting tested or screened for diseases. Follow your health care provider's instructions on monitoring your cholesterol and blood pressure. This information is not intended to replace advice given to you by your health care provider. Make sure you discuss any questions you have with your health care provider. Document Revised: 09/11/2020 Document Reviewed: 09/11/2020 Elsevier Patient Education  2024 [employer].

## 2023-10-07 NOTE — Assessment & Plan Note (Addendum)
 We discussed DEXA results. For now she prefers to hold on pharmacologic treatment, concerned about possible side effects. Continue adequate calcium and vitamin D  intake, fall precautions, and regular exercise that includes weightbearing exercises at least 3 times per week. We will plan on repeating bone density in 2 years.

## 2023-10-07 NOTE — Assessment & Plan Note (Signed)
 Problem is stable. Continue Wellbutrin  XL 150 mg daily and sertraline  100 mg daily. As far as problem is stable, annual follow-up is appropriate.

## 2023-10-07 NOTE — Assessment & Plan Note (Signed)
 We discussed the importance of regular physical activity and healthy diet for prevention of chronic illness and/or complications. Preventive guidelines reviewed. Vaccination: Shingrix  has been completed, prefers to hold on Prevnar 20. Ca++ and vit D supplementation to continue. Status post hysterectomy due to bleeding, no malignancy. Next CPE in a year.

## 2023-10-07 NOTE — Assessment & Plan Note (Signed)
Continue simvastatin 40 mg daily and low-fat diet. Further recommendation will be given according to lipid panel result.

## 2023-10-07 NOTE — Assessment & Plan Note (Signed)
 Problem is stable. Current.  Sertraline  100 mg daily. Follow-up in a year, before if needed.

## 2023-10-07 NOTE — Assessment & Plan Note (Signed)
 Currently she is not on B12 supplementation. Further recommendation will be given according to B12 result.

## 2023-10-27 ENCOUNTER — Ambulatory Visit: Payer: Self-pay | Admitting: Internal Medicine

## 2023-10-27 DIAGNOSIS — G8929 Other chronic pain: Secondary | ICD-10-CM

## 2023-10-27 DIAGNOSIS — R9389 Abnormal findings on diagnostic imaging of other specified body structures: Secondary | ICD-10-CM

## 2024-03-02 ENCOUNTER — Other Ambulatory Visit: Payer: Self-pay | Admitting: Family Medicine

## 2024-03-02 DIAGNOSIS — Z1231 Encounter for screening mammogram for malignant neoplasm of breast: Secondary | ICD-10-CM

## 2024-03-22 IMAGING — MG MM DIGITAL SCREENING BILAT W/ TOMO AND CAD
8 series · 8 of 24 positions shown · non-contrast
Comparison: Previous exams 12/23/2019 and earlier from [REDACTED],
[place].

CLINICAL DATA: Screening.

EXAM:
DIGITAL SCREENING BILATERAL MAMMOGRAM WITH TOMOSYNTHESIS AND CAD
TECHNIQUE: Bilateral screening digital craniocaudal and mediolateral oblique
mammograms were obtained. Bilateral screening digital breast
tomosynthesis was performed. The images were evaluated with
computer-aided detection.

[L MLO synth-2D]
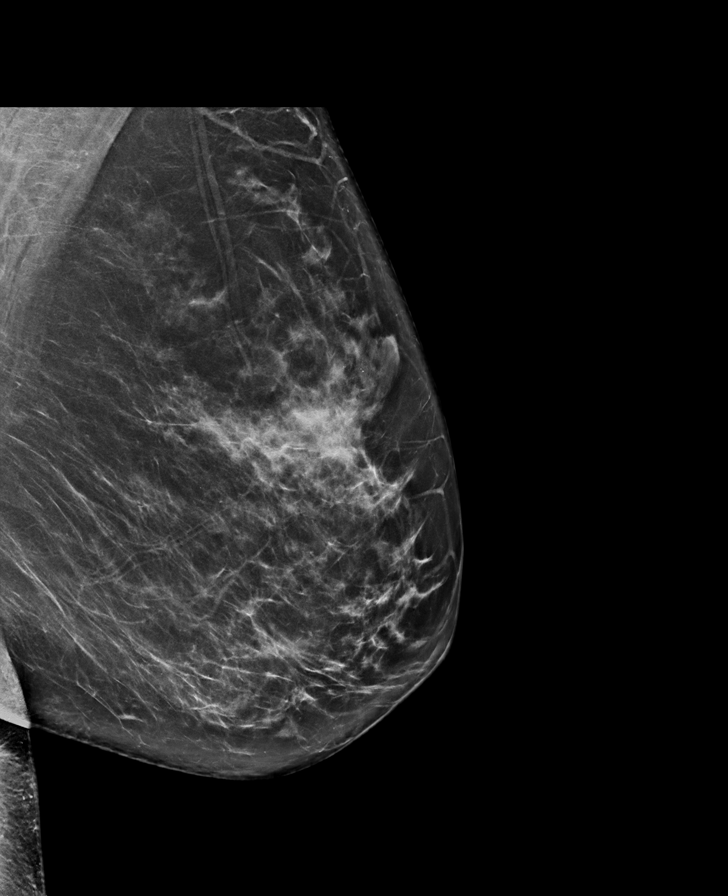

[L CC synth-2D]
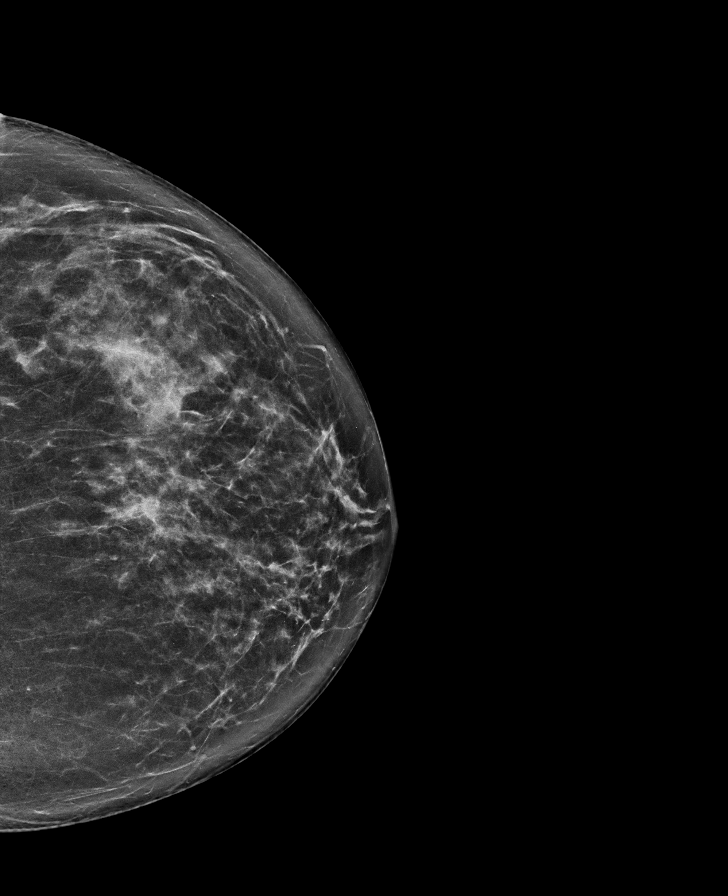

[R CC synth-2D]
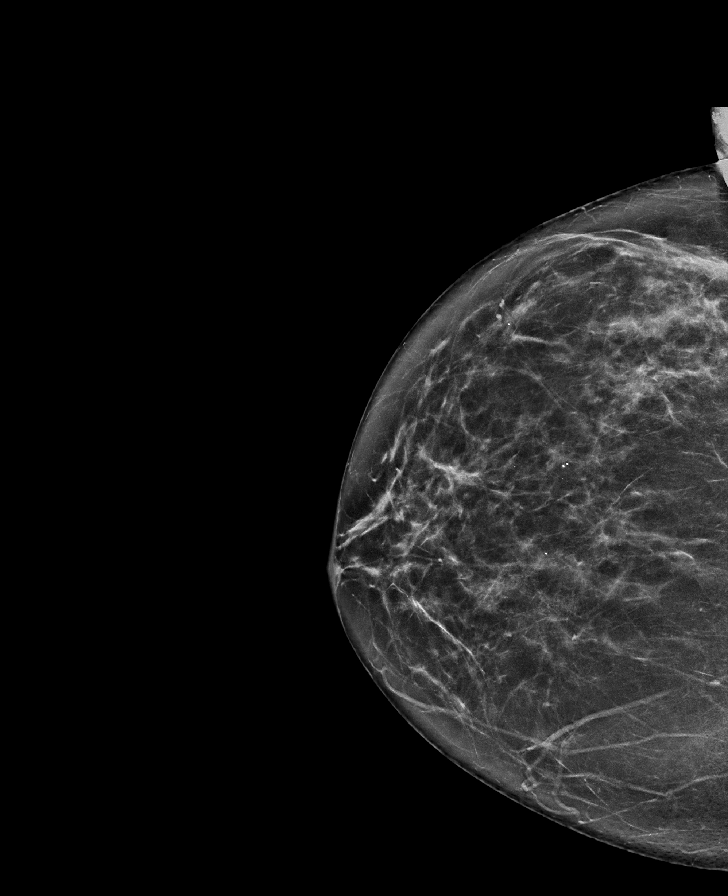

[R MLO synth-2D]
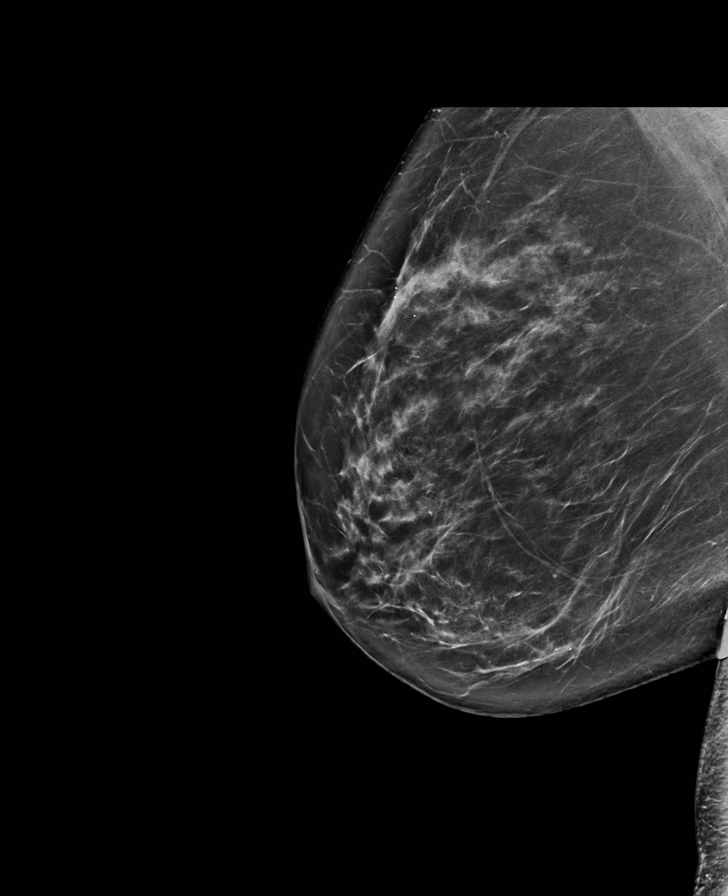

[L MLO tomo · tomo slice 44/87.0]
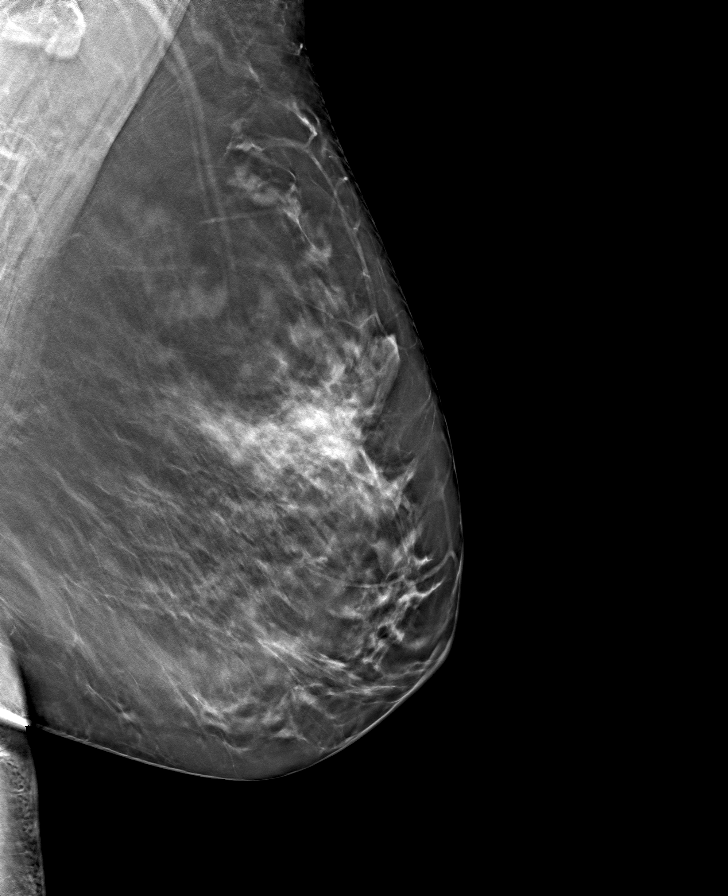

[R MLO tomo · tomo slice 43/86.0]
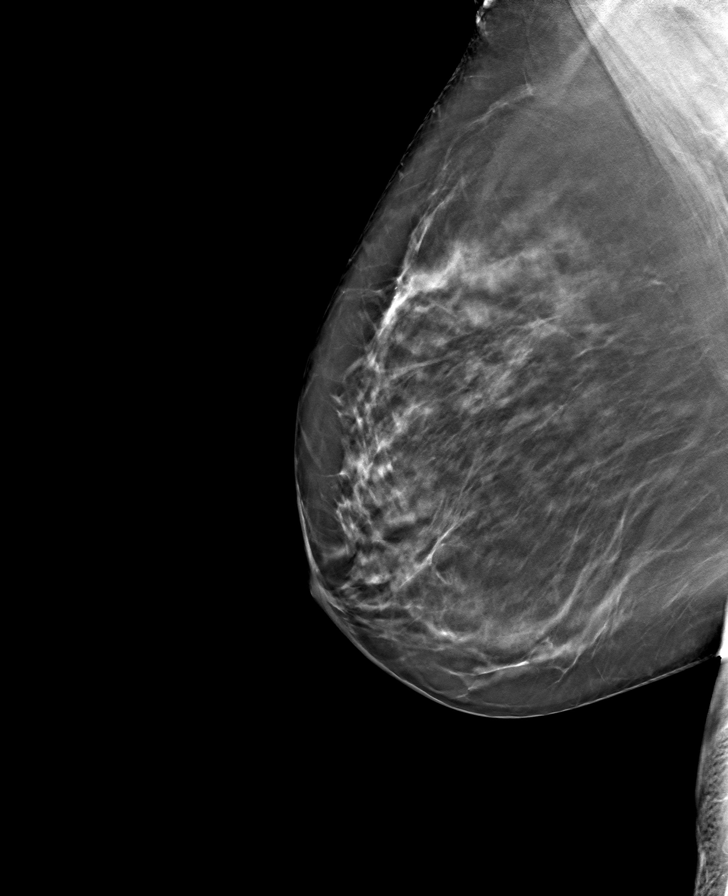

[R CC tomo · tomo slice 39/76.0]
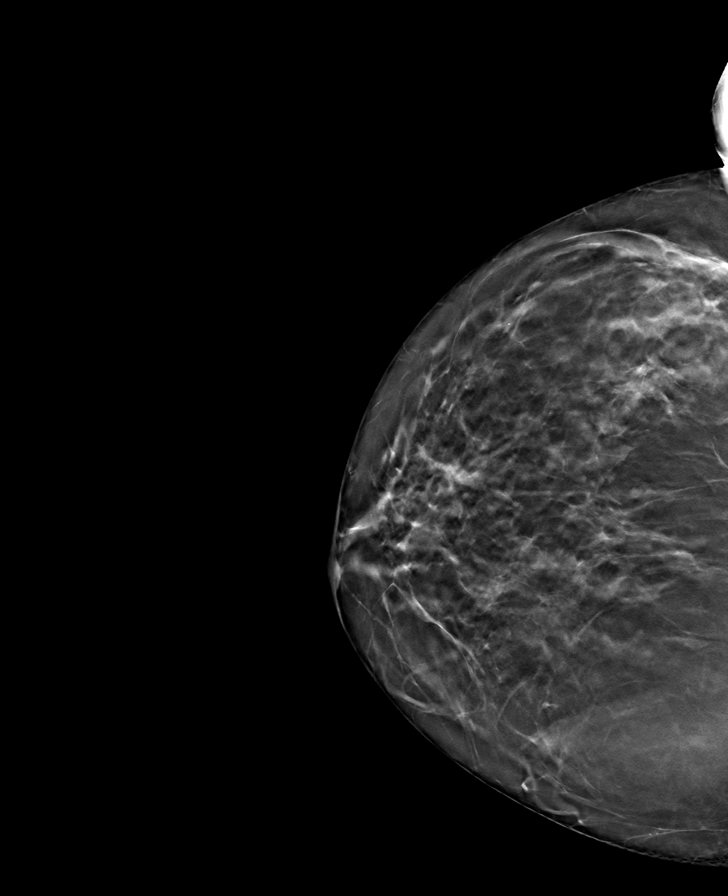

[L CC tomo · tomo slice 39/76.0]
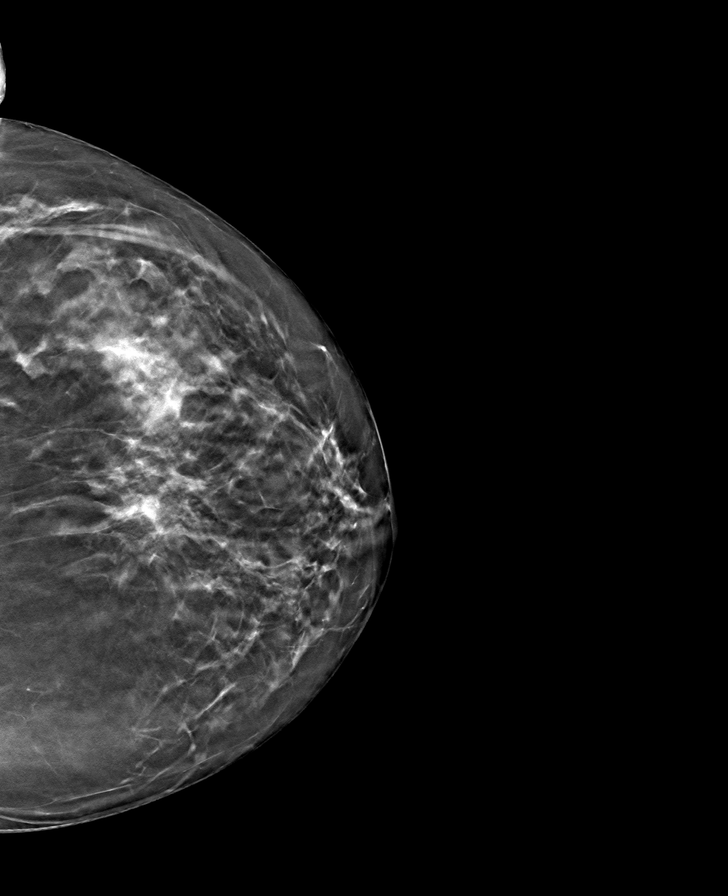

[8 of 24 positions shown; findings below may reference images not displayed]

Awaiting the prior outside mammograms accounts for the delay
in this report.

ACR Breast Density Category b: There are scattered areas of
fibroglandular density.
FINDINGS: There are no findings suspicious for malignancy.
IMPRESSION: No mammographic evidence of malignancy. A result letter of this
screening mammogram will be mailed directly to the patient.

RECOMMENDATION:
Screening mammogram in one year. (Code:UO-L-Q4Y)

BI-RADS CATEGORY  1: Negative.

## 2024-03-29 ENCOUNTER — Ambulatory Visit
Admission: RE | Admit: 2024-03-29 | Discharge: 2024-03-29 | Disposition: A | Source: Ambulatory Visit | Attending: Family Medicine | Admitting: Family Medicine

## 2024-03-29 DIAGNOSIS — Z1231 Encounter for screening mammogram for malignant neoplasm of breast: Secondary | ICD-10-CM

## 2024-03-30 ENCOUNTER — Encounter: Payer: Self-pay | Admitting: Gastroenterology

## 2024-04-05 ENCOUNTER — Other Ambulatory Visit: Payer: Self-pay | Admitting: Family Medicine

## 2024-04-05 DIAGNOSIS — F3341 Major depressive disorder, recurrent, in partial remission: Secondary | ICD-10-CM

## 2024-05-10 ENCOUNTER — Ambulatory Visit

## 2024-05-10 VITALS — Ht 67.0 in | Wt 162.2 lb

## 2024-05-10 DIAGNOSIS — Z860100 Personal history of colon polyps, unspecified: Secondary | ICD-10-CM

## 2024-05-10 MED ORDER — NA SULFATE-K SULFATE-MG SULF 17.5-3.13-1.6 GM/177ML PO SOLN: 1.0000 | Freq: Once | ORAL | 0 refills | Status: AC

## 2024-05-10 NOTE — Progress Notes (Signed)
No egg or soy allergy known to patient  No issues known to pt with past sedation with any surgeries or procedures Patient denies ever being told they had issues or difficulty with intubation  No FH of Malignant Hyperthermia Pt is not on diet pills Pt is not on  home 02  Pt is not on blood thinners  Pt has issues with constipation (2 day prep) No A fib or A flutter Have any cardiac testing pending--No Pt can ambulate  Pt denies use of chewing tobacco Discussed diabetic I weight loss medication holds Discussed NSAID holds Checked BMI Pt instructed to use Singlecare.com or GoodRx for a price reduction on prep  Patient's chart reviewed by Cathlyn Parsons CNRA prior to previsit and patient appropriate for the [facility].  Pre visit completed and red dot placed by patient's name on their procedure day (on provider's schedule).

## 2024-05-14 ENCOUNTER — Encounter: Payer: Self-pay | Admitting: Gastroenterology

## 2024-05-24 ENCOUNTER — Ambulatory Visit: Admitting: Gastroenterology

## 2024-05-24 ENCOUNTER — Encounter: Payer: Self-pay | Admitting: Gastroenterology

## 2024-05-24 VITALS — BP 127/71 | HR 66 | Temp 97.8°F | Resp 14 | Ht 67.0 in | Wt 162.2 lb

## 2024-05-24 DIAGNOSIS — Z800 Family history of malignant neoplasm of digestive organs: Secondary | ICD-10-CM

## 2024-05-24 DIAGNOSIS — Z860101 Personal history of adenomatous and serrated colon polyps: Secondary | ICD-10-CM | POA: Diagnosis not present

## 2024-05-24 DIAGNOSIS — Z860100 Personal history of colon polyps, unspecified: Secondary | ICD-10-CM

## 2024-05-24 DIAGNOSIS — K635 Polyp of colon: Secondary | ICD-10-CM

## 2024-05-24 DIAGNOSIS — D124 Benign neoplasm of descending colon: Secondary | ICD-10-CM

## 2024-05-24 DIAGNOSIS — K5730 Diverticulosis of large intestine without perforation or abscess without bleeding: Secondary | ICD-10-CM | POA: Diagnosis not present

## 2024-05-24 DIAGNOSIS — Q439 Congenital malformation of intestine, unspecified: Secondary | ICD-10-CM | POA: Diagnosis not present

## 2024-05-24 DIAGNOSIS — Z1211 Encounter for screening for malignant neoplasm of colon: Secondary | ICD-10-CM | POA: Diagnosis present

## 2024-05-24 DIAGNOSIS — K648 Other hemorrhoids: Secondary | ICD-10-CM | POA: Diagnosis not present

## 2024-05-24 DIAGNOSIS — D123 Benign neoplasm of transverse colon: Secondary | ICD-10-CM | POA: Diagnosis not present

## 2024-05-24 DIAGNOSIS — K6289 Other specified diseases of anus and rectum: Secondary | ICD-10-CM | POA: Diagnosis not present

## 2024-05-24 MED ORDER — SODIUM CHLORIDE 0.9 % IV SOLN: 500.0000 mL | INTRAVENOUS | Status: DC

## 2024-05-24 NOTE — Op Note (Signed)
 [facility] Patient Name: Desiree Doyle Procedure Date: 05/24/2024 8:23 AM MRN: [id number] Endoscopist: Elspeth P. Leigh , MD, [id number] Age: 64 Referring MD:  Date of Birth: November 14, 1960 Gender: Female Account #: [id number] Procedure:                Colonoscopy Indications:              High risk colon cancer surveillance: Personal                            history of colonic polyps - last exam 01/2022 - 3                            small adenomas (fair prep), family history of colon                            cancer in father dx age 66s. Double preparation                            used for this exam given prep on the last exam. Medicines:                Monitored Anesthesia Care Procedure:                Pre-Anesthesia Assessment:                           - Prior to the procedure, a History and Physical                            was performed, and patient medications and                            allergies were reviewed. The patient's tolerance of                            previous anesthesia was also reviewed. The risks                            and benefits of the procedure and the sedation                            options and risks were discussed with the patient.                            All questions were answered, and informed consent                            was obtained. Prior Anticoagulants: The patient has                            taken no anticoagulant or antiplatelet agents. ASA                            Grade Assessment: II - A patient with mild systemic  disease. After reviewing the risks and benefits,                            the patient was deemed in satisfactory condition to                            undergo the procedure.                           After obtaining informed consent, the colonoscope                            was passed under direct vision. Throughout the                            procedure,  the patient's blood pressure, pulse, and                            oxygen saturations were monitored continuously. The                            Olympus Scope [id number] was introduced through the                            anus and advanced to the the cecum, identified by                            appendiceal orifice and ileocecal valve. The                            colonoscopy was performed without difficulty. The                            patient tolerated the procedure well. The quality                            of the bowel preparation was good. The ileocecal                            valve, appendiceal orifice, and rectum were                            photographed. Scope In: 8:35:27 AM Scope Out: 8:57:00 AM Scope Withdrawal Time: 0 hours 14 minutes 28 seconds  Total Procedure Duration: 0 hours 21 minutes 33 seconds  Findings:                 The perianal and digital rectal examinations were                            normal.                           The colon was tortuous.  Two sessile polyps were found in the transverse                            colon. The polyps were 3 to 4 mm in size. These                            polyps were removed with a cold snare. Resection                            and retrieval were complete.                           A 3 to 4 mm polyp was found in the descending                            colon. The polyp was sessile. The polyp was removed                            with a cold snare. Resection and retrieval were                            complete.                           Multiple small-mouthed diverticula were found in                            the transverse colon and left colon.                           Anal papilla(e) were hypertrophied.                           Internal hemorrhoids were found during                            retroflexion. The hemorrhoids were small.                           The exam was  otherwise without abnormality. Complications:            No immediate complications. Estimated blood loss:                            Minimal. Estimated Blood Loss:     Estimated blood loss was minimal. Impression:               - Tortuous colon.                           - Two 3 to 4 mm polyps in the transverse colon,                            removed with a cold snare. Resected and retrieved.                           -  One 3 to 4 mm polyp in the descending colon,                            removed with a cold snare. Resected and retrieved.                           - Diverticulosis in the transverse colon and in the                            left colon.                           - Anal papilla(e) were hypertrophied.                           - Internal hemorrhoids.                           - The examination was otherwise normal. Recommendation:           - Patient has a contact number available for                            emergencies. The signs and symptoms of potential                            delayed complications were discussed with the                            patient. Return to normal activities tomorrow.                            Written discharge instructions were provided to the                            patient.                           - Resume previous diet.                           - Continue present medications.                           - Await pathology results. Elspeth P. Shakerra Red, MD 05/24/2024 9:03:49 AM This report has been signed electronically.

## 2024-05-24 NOTE — Progress Notes (Signed)
 Sedate, gd SR, tolerated procedure well, VSS, report to RN

## 2024-05-24 NOTE — Progress Notes (Signed)
 Called to room to assist during endoscopic procedure.  Patient ID and intended procedure confirmed with present staff. Received instructions for my participation in the procedure from the performing physician.

## 2024-05-24 NOTE — Progress Notes (Signed)
 Pt's states no medical or surgical changes since previsit or office visit.

## 2024-05-24 NOTE — Patient Instructions (Addendum)
-  Handout on polyps and diverticulosis provided -await pathology results -repeat colonoscopy for surveillance recommended. Date to be determined when pathology result become available   -Continue present medications   YOU HAD AN ENDOSCOPIC PROCEDURE TODAY AT THE [facility]:   Refer to the procedure report that was given to you for any specific questions about what was found during the examination.  If the procedure report does not answer your questions, please call your gastroenterologist to clarify.  If you requested that your care partner not be given the details of your procedure findings, then the procedure report has been included in a sealed envelope for you to review at your convenience later.  YOU SHOULD EXPECT: Some feelings of bloating in the abdomen. Passage of more gas than usual.  Walking can help get rid of the air that was put into your GI tract during the procedure and reduce the bloating. If you had a lower endoscopy (such as a colonoscopy or flexible sigmoidoscopy) you may notice spotting of blood in your stool or on the toilet paper. If you underwent a bowel prep for your procedure, you may not have a normal bowel movement for a few days.  Please Note:  You might notice some irritation and congestion in your nose or some drainage.  This is from the oxygen used during your procedure.  There is no need for concern and it should clear up in a day or so.  SYMPTOMS TO REPORT IMMEDIATELY:  Following lower endoscopy (colonoscopy or flexible sigmoidoscopy):  Excessive amounts of blood in the stool  Significant tenderness or worsening of abdominal pains  Swelling of the abdomen that is new, acute  Fever of 100F or higher   For urgent or emergent issues, a gastroenterologist can be reached at any hour by calling [phone]. Do not use MyChart messaging for urgent concerns.    DIET:  We do recommend a small meal at first, but then you may proceed to your regular  diet.  Drink plenty of fluids but you should avoid alcoholic beverages for 24 hours.  ACTIVITY:  You should plan to take it easy for the rest of today and you should NOT DRIVE or use heavy machinery until tomorrow (because of the sedation medicines used during the test).    FOLLOW UP: Our staff will call the number listed on your records the next business day following your procedure.  We will call around 7:15- 8:00 am to check on you and address any questions or concerns that you may have regarding the information given to you following your procedure. If we do not reach you, we will leave a message.     If any biopsies were taken you will be contacted by phone or by letter within the next 1-3 weeks.  Please call us at [phone] if you have not heard about the biopsies in 3 weeks.    SIGNATURES/CONFIDENTIALITY: You and/or your care partner have signed paperwork which will be entered into your electronic medical record.  These signatures attest to the fact that that the information above on your After Visit Summary has been reviewed and is understood.  Full responsibility of the confidentiality of this discharge information lies with you and/or your care-partner.   

## 2024-05-24 NOTE — Progress Notes (Signed)
 [facility] History and Physical   Primary Care Physician:  Jordan, Betty G, MD   Reason for Procedure:   History of colon polyps  Plan:    colonoscopy     HPI: Desiree Doyle is a 64 y.o. female  here for colonoscopy surveillance - history of polyps. Last exam 01/2022 - 3 adenomas, father had colon cancer dx age 50s. Last exam limited by fair prep - sooner than normally recommended colonoscopy due to prep.   Patient denies any new bowel symptoms at this time - has some baseline alternating loose stools / constipation. Otherwise feels well without any cardiopulmonary symptoms.   I have discussed risks / benefits of anesthesia and endoscopic procedure with [name] and they wish to proceed with the exams as outlined today.   The patient was provided an opportunity to ask questions and all were answered. The patient agreed with the plan.    Past Medical History:  Diagnosis Date   Chicken pox    Colon polyps    Depression    Frequent headaches    Glaucoma    Hyperlipidemia    Migraines     Past Surgical History:  Procedure Laterality Date   ABDOMINAL HYSTERECTOMY     APPENDECTOMY     COLONOSCOPY  12/2015   [place]    HEMORRHOID SURGERY     INCONTINENCE SURGERY  2014   POLYPECTOMY     TONSILLECTOMY      Prior to Admission medications  Medication Sig Start Date End Date Taking? Authorizing Provider  buPROPion  (WELLBUTRIN  XL) 150 MG 24 hr tablet TAKE 1 TABLET BY MOUTH EVERY DAY IN THE MORNING 04/05/24  Yes Jordan, Betty G, MD  sertraline  (ZOLOFT ) 100 MG tablet Take 1 tablet (100 mg total) by mouth daily. 10/07/23  Yes Jordan, Betty G, MD  CALCIUM PO Take by mouth.    [provider]  Cholecalciferol (VITAMIN D3 PO) Take by mouth.    [provider]  Fluocinolone  Acetonide 0.01 % OIL Place 1 drop in ear(s) daily as needed. Patient not taking: Reported on 05/24/2024 07/10/22   Jordan, Betty G, MD  fluticasone  (FLONASE ) 50 MCG/ACT nasal spray Place 2  sprays into both nostrils daily. 04/23/22   Christopher Savannah, PA-C  lansoprazole (PREVACID) 15 MG capsule Take 15 mg by mouth daily at 12 noon. Patient not taking: Reported on 05/24/2024    [provider]  latanoprost (XALATAN) 0.005 % ophthalmic solution Place 1 drop into both eyes every evening. 08/10/21   [provider]  Multiple Vitamins-Minerals (PRESERVISION AREDS PO) Take by mouth.    [provider]  simvastatin  (ZOCOR ) 40 MG tablet TAKE 1 TABLET BY MOUTH EVERY DAY 10/06/23   Jordan, Betty G, MD    Current Outpatient Medications  Medication Sig Dispense Refill   buPROPion  (WELLBUTRIN  XL) 150 MG 24 hr tablet TAKE 1 TABLET BY MOUTH EVERY DAY IN THE MORNING 90 tablet 1   sertraline  (ZOLOFT ) 100 MG tablet Take 1 tablet (100 mg total) by mouth daily. 90 tablet 2   CALCIUM PO Take by mouth.     Cholecalciferol (VITAMIN D3 PO) Take by mouth.     Fluocinolone  Acetonide 0.01 % OIL Place 1 drop in ear(s) daily as needed. (Patient not taking: Reported on 05/24/2024) 20 mL 0   fluticasone  (FLONASE ) 50 MCG/ACT nasal spray Place 2 sprays into both nostrils daily. 16 g 12   lansoprazole (PREVACID) 15 MG capsule Take 15 mg by mouth daily at 12 noon. (  Patient not taking: Reported on 05/24/2024)     latanoprost (XALATAN) 0.005 % ophthalmic solution Place 1 drop into both eyes every evening.     Multiple Vitamins-Minerals (PRESERVISION AREDS PO) Take by mouth.     simvastatin  (ZOCOR ) 40 MG tablet TAKE 1 TABLET BY MOUTH EVERY DAY 90 tablet 3   Current Facility-Administered Medications  Medication Dose Route Frequency Provider Last Rate Last Admin   0.9 %  sodium chloride  infusion  500 mL Intravenous Continuous Ingra Rother, Elspeth SQUIBB, MD        Allergies as of 05/24/2024   (No Known Allergies)    Family History  Problem Relation Age of Onset   Hyperlipidemia Mother    Hearing loss Mother    Hypertension Father    Early death Father    Diabetes Father    Colon cancer Father 2    Breast cancer Maternal Aunt        x2 90s or 3s   Esophageal cancer Neg Hx    Stomach cancer Neg Hx    Rectal cancer Neg Hx     Social History   Socioeconomic History   Marital status: Single    Spouse name: Not on file   Number of children: Not on file   Years of education: Not on file   Highest education level: Master's degree (e.g., MA, MS, MEng, MEd, MSW, MBA)  Occupational History   Not on file  Tobacco Use   Smoking status: Never    Passive exposure: Never   Smokeless tobacco: Never  Vaping Use   Vaping status: Never Used  Substance and Sexual Activity   Alcohol use: Yes    Comment: rare   Drug use: Never   Sexual activity: Not Currently  Other Topics Concern   Not on file  Social History Narrative   Not on file   Social Drivers of Health   Tobacco Use: Low Risk (05/24/2024)   Patient History    Smoking Tobacco Use: Never    Smokeless Tobacco Use: Never    Passive Exposure: Never  Financial Resource Strain: Low Risk (10/06/2023)   Overall Financial Resource Strain (CARDIA)    Difficulty of Paying Living Expenses: Not very hard  Food Insecurity: Food Insecurity Present (10/06/2023)   Hunger Vital Sign    Worried About Running Out of Food in the Last Year: Sometimes true    Ran Out of Food in the Last Year: Sometimes true  Transportation Needs: No Transportation Needs (10/06/2023)   PRAPARE - Administrator, Civil Service (Medical): No    Lack of Transportation (Non-Medical): No  Physical Activity: Insufficiently Active (10/06/2023)   Exercise Vital Sign    Days of Exercise per Week: 2 days    Minutes of Exercise per Session: 10 min  Stress: No Stress Concern Present (10/06/2023)   [employer] of Occupational Health - Occupational Stress Questionnaire    Feeling of Stress : Only a little  Social Connections: Moderately Isolated (10/06/2023)   Social Connection and Isolation Panel    Frequency of Communication with Friends and Family: Twice a  week    Frequency of Social Gatherings with Friends and Family: Twice a week    Attends Religious Services: 1 to 4 times per year    Active Member of [employer] or Organizations: No    Attends Banker Meetings: Not on file    Marital Status: Never married  Intimate Partner Violence: Not on file  Depression (EYV7-0): Low Risk (  10/07/2023)   Depression (PHQ2-9)    PHQ-2 Score: 4  Alcohol Screen: Low Risk (10/06/2023)   Alcohol Screen    Last Alcohol Screening Score (AUDIT): 1  Housing: Low Risk (10/06/2023)   Housing Stability Vital Sign    Unable to Pay for Housing in the Last Year: No    Number of Times Moved in the Last Year: 0    Homeless in the Last Year: No  Utilities: Not on file  Health Literacy: Not on file    Review of Systems: All other review of systems negative except as mentioned in the HPI.  Physical Exam: Vital signs BP (!) 139/91   Pulse 88   Temp 97.8 F (36.6 C) (Temporal)   Ht 5' 7 (1.702 m)   Wt 162 lb 3.2 oz (73.6 kg)   SpO2 95%   BMI 25.40 kg/m   General:   Alert,  Well-developed, pleasant and cooperative in NAD Lungs:  Clear throughout to auscultation.   Heart:  Regular rate and rhythm Abdomen:  Soft, nontender and nondistended.   Neuro/Psych:  Alert and cooperative. Normal mood and affect. A and O x 3  Marcey Naval, MD [facility]

## 2024-05-25 ENCOUNTER — Telehealth: Payer: Self-pay | Admitting: *Deleted

## 2024-05-25 NOTE — Telephone Encounter (Signed)
 No answer on follow up call. Left message.

## 2024-05-26 ENCOUNTER — Ambulatory Visit: Payer: Self-pay | Admitting: Gastroenterology

## 2024-05-26 LAB — SURGICAL PATHOLOGY
